# Patient Record
Sex: Male | Born: 1970 | Hispanic: No | Marital: Single | Smoking: Former smoker
Health system: Southern US, Community
[De-identification: ages and names within clinical notes are randomized; demographics above are authoritative.]

## PROBLEM LIST (undated history)

## (undated) DIAGNOSIS — I10 Essential (primary) hypertension: Secondary | ICD-10-CM

## (undated) DIAGNOSIS — F32A Depression, unspecified: Secondary | ICD-10-CM

## (undated) DIAGNOSIS — F909 Attention-deficit hyperactivity disorder, unspecified type: Secondary | ICD-10-CM

## (undated) DIAGNOSIS — M1990 Unspecified osteoarthritis, unspecified site: Secondary | ICD-10-CM

## (undated) DIAGNOSIS — F329 Major depressive disorder, single episode, unspecified: Secondary | ICD-10-CM

## (undated) DIAGNOSIS — F419 Anxiety disorder, unspecified: Secondary | ICD-10-CM

---

## 2007-12-02 HISTORY — PX: CHOLECYSTECTOMY: SHX55

## 2010-12-02 HISTORY — PX: KNEE ARTHROSCOPY: SUR90

## 2011-10-04 HISTORY — PX: LAPAROSCOPIC GASTRIC SLEEVE RESECTION: SHX5895

## 2014-12-02 HISTORY — PX: KNEE ARTHROSCOPY: SHX127

## 2018-01-24 ENCOUNTER — Other Ambulatory Visit: Payer: Self-pay | Admitting: Orthopedic Surgery

## 2018-02-09 ENCOUNTER — Encounter (HOSPITAL_COMMUNITY)
Admission: RE | Admit: 2018-02-09 | Discharge: 2018-02-09 | Disposition: A | Discharge status: Home / Self Care | Payer: No Typology Code available for payment source | Source: Ambulatory Visit | Attending: Orthopedic Surgery | Admitting: Orthopedic Surgery

## 2018-02-09 ENCOUNTER — Other Ambulatory Visit: Payer: Self-pay

## 2018-02-09 ENCOUNTER — Ambulatory Visit (HOSPITAL_COMMUNITY)
Admission: RE | Admit: 2018-02-09 | Discharge: 2018-02-09 | Disposition: A | Discharge status: Home / Self Care | Payer: No Typology Code available for payment source | Source: Ambulatory Visit | Attending: Orthopedic Surgery | Admitting: Orthopedic Surgery

## 2018-02-09 ENCOUNTER — Encounter (HOSPITAL_COMMUNITY): Payer: Self-pay

## 2018-02-09 DIAGNOSIS — Z01812 Encounter for preprocedural laboratory examination: Secondary | ICD-10-CM | POA: Diagnosis present

## 2018-02-09 DIAGNOSIS — Z01818 Encounter for other preprocedural examination: Secondary | ICD-10-CM | POA: Diagnosis present

## 2018-02-09 HISTORY — DX: Major depressive disorder, single episode, unspecified: F32.9

## 2018-02-09 HISTORY — DX: Attention-deficit hyperactivity disorder, unspecified type: F90.9

## 2018-02-09 HISTORY — DX: Depression, unspecified: F32.A

## 2018-02-09 HISTORY — DX: Essential (primary) hypertension: I10

## 2018-02-09 HISTORY — DX: Unspecified osteoarthritis, unspecified site: M19.90

## 2018-02-09 HISTORY — DX: Anxiety disorder, unspecified: F41.9

## 2018-02-09 LAB — APTT: aPTT: 29 seconds (ref 24–36)

## 2018-02-09 LAB — SURGICAL PCR SCREEN
MRSA, PCR: NEGATIVE
Staphylococcus aureus: NEGATIVE

## 2018-02-09 LAB — CBC WITH DIFFERENTIAL/PLATELET
Basophils Absolute: 0 10*3/uL (ref 0.0–0.1)
Basophils Relative: 0 %
Eosinophils Absolute: 0.2 10*3/uL (ref 0.0–0.7)
Eosinophils Relative: 3 %
HEMATOCRIT: 49.3 % (ref 39.0–52.0)
HEMOGLOBIN: 16.5 g/dL (ref 13.0–17.0)
LYMPHS ABS: 2.1 10*3/uL (ref 0.7–4.0)
LYMPHS PCT: 34 %
MCH: 30.2 pg (ref 26.0–34.0)
MCHC: 33.5 g/dL (ref 30.0–36.0)
MCV: 90.1 fL (ref 78.0–100.0)
MONOS PCT: 11 %
Monocytes Absolute: 0.7 10*3/uL (ref 0.1–1.0)
NEUTROS ABS: 3.2 10*3/uL (ref 1.7–7.7)
NEUTROS PCT: 52 %
Platelets: 245 10*3/uL (ref 150–400)
RBC: 5.47 MIL/uL (ref 4.22–5.81)
RDW: 13.3 % (ref 11.5–15.5)
WBC: 6 10*3/uL (ref 4.0–10.5)

## 2018-02-09 LAB — URINALYSIS, ROUTINE W REFLEX MICROSCOPIC
BILIRUBIN URINE: NEGATIVE
Glucose, UA: NEGATIVE mg/dL
Hgb urine dipstick: NEGATIVE
KETONES UR: 5 mg/dL — AB
LEUKOCYTES UA: NEGATIVE
Nitrite: NEGATIVE
PH: 6 (ref 5.0–8.0)
PROTEIN: 30 mg/dL — AB
Specific Gravity, Urine: 1.028 (ref 1.005–1.030)

## 2018-02-09 LAB — PROTIME-INR
INR: 0.95
Prothrombin Time: 12.6 seconds (ref 11.4–15.2)

## 2018-02-09 LAB — TYPE AND SCREEN
ABO/RH(D): O NEG
Antibody Screen: NEGATIVE

## 2018-02-09 LAB — BASIC METABOLIC PANEL
Anion gap: 8 (ref 5–15)
BUN: 13 mg/dL (ref 6–20)
CHLORIDE: 103 mmol/L (ref 101–111)
CO2: 28 mmol/L (ref 22–32)
Calcium: 9.1 mg/dL (ref 8.9–10.3)
Creatinine, Ser: 0.99 mg/dL (ref 0.61–1.24)
GFR calc non Af Amer: >60 mL/min (ref >60)
Glucose, Bld: 104 mg/dL — ABNORMAL HIGH (ref 65–99)
POTASSIUM: 4.3 mmol/L (ref 3.5–5.1)
SODIUM: 139 mmol/L (ref 135–145)

## 2018-02-09 LAB — ABO/RH: ABO/RH(D): O NEG

## 2018-02-09 NOTE — Pre-Procedure Instructions (Signed)
Steven Abbott  02/09/2018        Your procedure is scheduled on Friday, May 24.  Report to [facility] Admitting at 7:30 A.M.               Your surgery or procedure is scheduled for 9:30 AM   Call this number if you have problems the morning of surgery: [phone] - pre- op desk                For any other questions prior to surgery Monday - Friday, 8:00 AM - 4:00 PM, call [phone]- PAT desk, ask to speak to any nurse.     Remember:  Do not eat food or drink liquids after midnight  Thursday, May 23.   Take these medicines the morning of surgery with A SIP OF WATER: buPROPion (WELLBUTRIN XL)  busPIRone (BUSPAR) dicyclomine (BENTYL) metoprolol succinate (TOPROL-XL)  Take if needed and you tolerate on an empty stomach: oxyCODONE-acetaminophen (PERCOCET/ROXICET) or acetaminophen (TYLENOL)  1 Week prior to surgery STOP taking Aspirin, Aspirin Products (Goody Powder, Excedrin Migraine), Ibuprofen (Advil), Naproxen (Aleve), Vitamins and Herbal Products (ie Fish Oil)  Special instructions:   [facility]- Preparing For Surgery  Before surgery, you can play an important role. Because skin is not sterile, your skin needs to be as free of germs as possible. You can reduce the number of germs on your skin by washing with CHG (chlorahexidine gluconate) Soap before surgery.  CHG is an antiseptic cleaner which kills germs and bonds with the skin to continue killing germs even after washing.   Please do not use if you have an allergy to CHG or antibacterial soaps. If your skin becomes reddened/irritated stop using the CHG.  Do not shave (including legs and underarms) for at least 48 hours prior to first CHG shower. It is OK to shave your face.  Please follow these instructions carefully.   1. Shower the NIGHT BEFORE SURGERY and the MORNING OF SURGERY with CHG.   2. If you chose to wash your hair, wash your hair first as usual with your normal shampoo.  3. After you  shampoo, rinse your hair and body thoroughly to remove the shampoo.    Wash your face and private area with the soap you use at home, then rinse.  4. Use CHG as you would any other liquid soap. You can apply CHG directly to the skin and wash gently with a scrungie or a clean washcloth.   5. Apply the CHG Soap to your body ONLY FROM THE NECK DOWN.  Do not use on open wounds or open sores. Avoid contact with your eyes, ears, mouth and genitals (private parts).Wash thoroughly, paying special attention to the area where your surgery will be performed.  6. Thoroughly rinse your body with warm water from the neck down.  7. DO NOT shower/wash with your normal soap after using and rinsing off the CHG Soap.  8. Pat yourself dry with a CLEAN TOWEL.  9. Wear CLEAN PAJAMAS to bed the night before surgery, wear comfortable clothes the morning of surgery  10. Place CLEAN SHEETS on your bed the night of your first shower and DO NOT SLEEP WITH PETS.  Day of Surgery:  Shower as above. Do not apply any deodorants/lotions. Please wear clean clothes to the hospital/surgery center.  Remember to brush your teeth.    Do not wear jewelry, make-up or nail polish.  Do not wear lotions, powders, or perfumes,  or deodorant.  Do not shave 48 hours prior to surgery.  Men may shave face and neck.  Do not bring valuables to the hospital.  [facility] is not responsible for any belongings or valuables.  Oral Hygiene is also important to reduce your risk of infection.  Remember - BRUSH YOUR TEETH THE MORNING OF SURGERY  Contacts, dentures or bridgework may not be worn into surgery.  Leave your suitcase in the car.  After surgery it may be brought to your room.  For patients admitted to the hospital, discharge time will be determined by your treatment team.  Patients discharged the day of surgery will not be allowed to drive home.   Please read over the following fact sheets that you were given: Pain Booklet, Patient  Instructions for Mupirocin Application, Incentive Spirometry, Surgical Site Infections.

## 2018-02-09 NOTE — Pre-Procedure Instructions (Signed)
Gatsby Chismar  02/09/2018        Your procedure is scheduled on Friday, May 24.  Report to [facility] Admitting at 5:30 A.M.               Your surgery or procedure is scheduled for 7:30 AM   Call this number if you have problems the morning of surgery: [phone] - pre- op desk                For any other questions prior to surgery Monday - Friday, 8:00 AM - 4:00 PM, call [phone]- PAT desk, ask to speak to any nurse.     Remember:  Do not eat food or drink liquids after midnight  Thursday, May 23.   Take these medicines the morning of surgery with A SIP OF WATER: buPROPion (WELLBUTRIN XL)  busPIRone (BUSPAR) dicyclomine (BENTYL) metoprolol succinate (TOPROL-XL)  Take if needed and you tolerate on an empty stomach: oxyCODONE-acetaminophen (PERCOCET/ROXICET) or acetaminophen (TYLENOL)  1 Week prior to surgery STOP taking Aspirin, Aspirin Products (Goody Powder, Excedrin Migraine), Ibuprofen (Advil), Naproxen (Aleve), Vitamins and Herbal Products (ie Fish Oil)  Special instructions:   [place]- Preparing For Surgery  Before surgery, you can play an important role. Because skin is not sterile, your skin needs to be as free of germs as possible. You can reduce the number of germs on your skin by washing with CHG (chlorahexidine gluconate) Soap before surgery.  CHG is an antiseptic cleaner which kills germs and bonds with the skin to continue killing germs even after washing.   Please do not use if you have an allergy to CHG or antibacterial soaps. If your skin becomes reddened/irritated stop using the CHG.  Do not shave (including legs and underarms) for at least 48 hours prior to first CHG shower. It is OK to shave your face.  Please follow these instructions carefully.   1. Shower the NIGHT BEFORE SURGERY and the MORNING OF SURGERY with CHG.   2. If you chose to wash your hair, wash your hair first as usual with your normal shampoo.  3. After you  shampoo, rinse your hair and body thoroughly to remove the shampoo.    Wash your face and private area with the soap you use at home, then rinse.  4. Use CHG as you would any other liquid soap. You can apply CHG directly to the skin and wash gently with a scrungie or a clean washcloth.   5. Apply the CHG Soap to your body ONLY FROM THE NECK DOWN.  Do not use on open wounds or open sores. Avoid contact with your eyes, ears, mouth and genitals (private parts).Wash thoroughly, paying special attention to the area where your surgery will be performed.  6. Thoroughly rinse your body with warm water from the neck down.  7. DO NOT shower/wash with your normal soap after using and rinsing off the CHG Soap.  8. Pat yourself dry with a CLEAN TOWEL.  9. Wear CLEAN PAJAMAS to bed the night before surgery, wear comfortable clothes the morning of surgery  10. Place CLEAN SHEETS on your bed the night of your first shower and DO NOT SLEEP WITH PETS.  Day of Surgery:  Shower as above. Do not apply any deodorants/lotions. Please wear clean clothes to the hospital/surgery center.  Remember to brush your teeth.    Do not wear jewelry, make-up or nail polish.  Do not wear lotions, powders, or perfumes,  or deodorant.  Do not shave 48 hours prior to surgery.  Men may shave face and neck.  Do not bring valuables to the hospital.  [facility] is not responsible for any belongings or valuables.  Oral Hygiene is also important to reduce your risk of infection.  Remember - BRUSH YOUR TEETH THE MORNING OF SURGERY  Contacts, dentures or bridgework may not be worn into surgery.  Leave your suitcase in the car.  After surgery it may be brought to your room.  For patients admitted to the hospital, discharge time will be determined by your treatment team.  Patients discharged the day of surgery will not be allowed to drive home.   Please read over the following fact sheets that you were given: Pain Booklet, Patient  Instructions for Mupirocin Application, Incentive Spirometry, Surgical Site Infections.

## 2018-02-12 ENCOUNTER — Other Ambulatory Visit (HOSPITAL_COMMUNITY): Payer: Self-pay

## 2018-02-20 DIAGNOSIS — M1712 Unilateral primary osteoarthritis, left knee: Secondary | ICD-10-CM | POA: Diagnosis present

## 2018-02-20 NOTE — H&P (Signed)
TOTAL KNEE ADMISSION H&P  Patient is being admitted for left total knee arthroplasty.  Subjective:  Chief Complaint:left knee pain.  HPI: Steven Abbott, 47 y.o. male, has a history of pain and functional disability in the left knee due to arthritis and has failed non-surgical conservative treatments for greater than 12 weeks to includeNSAID's and/or analgesics, flexibility and strengthening excercises, supervised PT with diminished ADL's post treatment and activity modification.  Onset of symptoms was abrupt, starting 3 years ago with gradually worsening course since that time. The patient noted prior procedures on the knee to include  arthroscopy on the left knee(s).  Patient currently rates pain in the left knee(s) at 10 out of 10 with activity. Patient has night pain, worsening of pain with activity and weight bearing, pain that interferes with activities of daily living, pain with passive range of motion, crepitus and joint swelling.  Patient has evidence of joint space narrowing by imaging studies.   There is no active infection.  There are no active problems to display for this patient.  Past Medical History:  Diagnosis Date  . ADHD   . Anxiety   . Arthritis   . Depression   . Hypertension     Past Surgical History:  Procedure Laterality Date  . CHOLECYSTECTOMY  12/2007  . KNEE ARTHROSCOPY  12/2010   "cleaned it out"  . KNEE ARTHROSCOPY  12/2014   MCL  . LAPAROSCOPIC GASTRIC SLEEVE RESECTION  2013    No current facility-administered medications for this encounter.    Current Outpatient Medications  Medication Sig Dispense Refill Last Dose  . acetaminophen (TYLENOL) 650 MG CR tablet Take 1,300 mg by mouth 2 (two) times daily as needed for pain.     Marland Kitchen amphetamine-dextroamphetamine (ADDERALL) 20 MG tablet Take 20 mg by mouth 2 (two) times daily.     Marland Kitchen buPROPion (WELLBUTRIN XL) 300 MG 24 hr tablet Take 300 mg by mouth daily.     . busPIRone (BUSPAR) 10 MG tablet Take 10 mg by  mouth 3 (three) times daily.     . Cholecalciferol (VITAMIN D3 PO) Take 1 capsule by mouth daily.     . Cyanocobalamin (B-12) 1000 MCG SUBL Place 1,000 mcg under the tongue daily.     Marland Kitchen dicyclomine (BENTYL) 10 MG capsule Take 10 mg by mouth 2 (two) times daily.     . Ferrous Sulfate Dried (SLOW IRON PO) Take 1 tablet by mouth daily.     Marland Kitchen lidocaine (LMX) 4 % cream Apply 1 application topically as needed (knee pain).     . Melatonin 10 MG TABS Take 30 mg by mouth at bedtime as needed (sleep).     . metoprolol succinate (TOPROL-XL) 50 MG 24 hr tablet Take 50 mg by mouth daily. Take with or immediately following a meal.     . Multiple Vitamin (MULTIVITAMIN WITH MINERALS) TABS tablet Take 1 tablet by mouth 2 (two) times daily.     Marland Kitchen oxyCODONE-acetaminophen (PERCOCET/ROXICET) 5-325 MG tablet Take 1 tablet by mouth 4 (four) times daily as needed for severe pain.     Marland Kitchen testosterone enanthate (DELATESTRYL) 200 MG/ML injection Inject 200 mg into the muscle every 14 (fourteen) days. For IM use only     . ranitidine (ZANTAC) 150 MG capsule Take 150 mg by mouth 2 (two) times daily.      No Known Allergies  Social History   Tobacco Use  . Smoking status: Former Smoker    Years: 9.00  Last attempt to quit: 1998    Years since quitting: 21.3  . Smokeless tobacco: Never Used  Substance Use Topics  . Alcohol use: Not Currently    Family History  Problem Relation Age of Onset  . Arthritis Mother   . Diabetes Father   . Stroke Father   . Microcephaly Father   . Prostate cancer Father      Review of Systems  Constitutional: Negative.   HENT: Negative.   Eyes: Negative.   Respiratory: Negative.   Cardiovascular:       HTN  Gastrointestinal: Positive for heartburn.  Genitourinary: Negative.   Musculoskeletal: Positive for joint pain.  Skin: Negative.   Neurological: Negative.   Endo/Heme/Allergies: Negative.   Psychiatric/Behavioral: Positive for depression.    Objective:  Physical  Exam  Constitutional: He is oriented to person, place, and time. He appears well-developed and well-nourished.  HENT:  Head: Normocephalic and atraumatic.  Eyes: Pupils are equal, round, and reactive to light.  Neck: Normal range of motion. Neck supple.  Cardiovascular: Intact distal pulses.  Respiratory: Effort normal.  Musculoskeletal: He exhibits tenderness.  The left knee is tender along the medial joint line.  Range of motion is 8/115 collateral ligaments are stable.  Varus stress exacerbates his pain no palpable effusion, crepitus as you taken through range of motion.  Neurological: He is alert and oriented to person, place, and time.  Skin: Skin is warm and dry.  Psychiatric: He has a normal mood and affect. His behavior is normal. Judgment and thought content normal.    Vital signs in last 24 hours:    Labs:   Estimated body mass index is 32.72 kg/m as calculated from the following:   Height as of 02/09/18:  (1.803 m).   Weight as of 02/09/18: 106.4 kg (234 lb 9.6 oz).   Imaging Review Plain radiographs demonstrateAP, Rosenberg, lateral and sunrise x-rays show moderate to severe arthritis of the medial compartment of the left knee.  There are peripheral osteophytes and loss of a little over one half the articular cartilage thickness to the medial compartment.     Preoperative templating of the joint replacement has been completed, documented, and submitted to the Operating Room personnel in order to optimize intra-operative equipment management.   Anticipated LOS equal to or greater than 2 midnights due to - Age 64 and older with one or more of the following:  - Obesity  - Expected need for hospital services (PT, OT, Nursing) required for safe  discharge   Assessment/Plan:  End stage arthritis, left knee   The patient history, physical examination, clinical judgment of the provider and imaging studies are consistent with end stage degenerative joint disease of  the left knee(s) and total knee arthroplasty is deemed medically necessary. The treatment options including medical management, injection therapy arthroscopy and arthroplasty were discussed at length. The risks and benefits of total knee arthroplasty were presented and reviewed. The risks due to aseptic loosening, infection, stiffness, patella tracking problems, thromboembolic complications and other imponderables were discussed. The patient acknowledged the explanation, agreed to proceed with the plan and consent was signed. Patient is being admitted for inpatient treatment for surgery, pain control, PT, OT, prophylactic antibiotics, VTE prophylaxis, progressive ambulation and ADL's and discharge planning. The patient is planning to be discharged home with home health services.

## 2018-02-22 MED ORDER — TRANEXAMIC ACID 1000 MG/10ML IV SOLN
2000.0000 mg | INTRAVENOUS | Status: DC
  Filled 2018-02-22: qty 20

## 2018-02-22 MED ORDER — TRANEXAMIC ACID 1000 MG/10ML IV SOLN
1000.0000 mg | INTRAVENOUS | Status: AC
  Administered 2018-02-23: 1000 mg via INTRAVENOUS
  Filled 2018-02-22: qty 1100

## 2018-02-22 MED ORDER — BUPIVACAINE LIPOSOME 1.3 % IJ SUSP
20.0000 mL | Freq: Once | INTRAMUSCULAR | Status: DC
  Filled 2018-02-22: qty 20

## 2018-02-23 ENCOUNTER — Inpatient Hospital Stay (HOSPITAL_COMMUNITY): Payer: No Typology Code available for payment source | Admitting: Anesthesiology

## 2018-02-23 ENCOUNTER — Inpatient Hospital Stay (HOSPITAL_COMMUNITY)
Admission: RE | Admit: 2018-02-23 | Discharge: 2018-02-24 | DRG: 470 | Disposition: A | Discharge status: Home / Self Care | Payer: No Typology Code available for payment source | Source: Ambulatory Visit | Attending: Orthopedic Surgery | Admitting: Orthopedic Surgery

## 2018-02-23 ENCOUNTER — Encounter (HOSPITAL_COMMUNITY): Payer: Self-pay | Admitting: *Deleted

## 2018-02-23 ENCOUNTER — Encounter (HOSPITAL_COMMUNITY)
Admission: RE | Disposition: A | Discharge status: Home / Self Care | Payer: Self-pay | Source: Ambulatory Visit | Attending: Orthopedic Surgery

## 2018-02-23 DIAGNOSIS — Z833 Family history of diabetes mellitus: Secondary | ICD-10-CM

## 2018-02-23 DIAGNOSIS — Z6832 Body mass index (BMI) 32.0-32.9, adult: Secondary | ICD-10-CM

## 2018-02-23 DIAGNOSIS — E669 Obesity, unspecified: Secondary | ICD-10-CM | POA: Diagnosis present

## 2018-02-23 DIAGNOSIS — F909 Attention-deficit hyperactivity disorder, unspecified type: Secondary | ICD-10-CM | POA: Diagnosis present

## 2018-02-23 DIAGNOSIS — Z87891 Personal history of nicotine dependence: Secondary | ICD-10-CM

## 2018-02-23 DIAGNOSIS — M1712 Unilateral primary osteoarthritis, left knee: Secondary | ICD-10-CM | POA: Diagnosis present

## 2018-02-23 DIAGNOSIS — Z9049 Acquired absence of other specified parts of digestive tract: Secondary | ICD-10-CM

## 2018-02-23 DIAGNOSIS — F419 Anxiety disorder, unspecified: Secondary | ICD-10-CM | POA: Diagnosis present

## 2018-02-23 DIAGNOSIS — Z8042 Family history of malignant neoplasm of prostate: Secondary | ICD-10-CM | POA: Diagnosis not present

## 2018-02-23 DIAGNOSIS — Z823 Family history of stroke: Secondary | ICD-10-CM | POA: Diagnosis not present

## 2018-02-23 DIAGNOSIS — M25562 Pain in left knee: Secondary | ICD-10-CM | POA: Diagnosis present

## 2018-02-23 DIAGNOSIS — Z8261 Family history of arthritis: Secondary | ICD-10-CM

## 2018-02-23 DIAGNOSIS — I10 Essential (primary) hypertension: Secondary | ICD-10-CM | POA: Diagnosis present

## 2018-02-23 DIAGNOSIS — F329 Major depressive disorder, single episode, unspecified: Secondary | ICD-10-CM | POA: Diagnosis present

## 2018-02-23 HISTORY — PX: TOTAL KNEE ARTHROPLASTY: SHX125

## 2018-02-23 SURGERY — ARTHROPLASTY, KNEE, TOTAL
Anesthesia: Spinal | Site: Knee | Laterality: Left

## 2018-02-23 MED ORDER — FENTANYL CITRATE (PF) 100 MCG/2ML IJ SOLN
INTRAMUSCULAR | Status: AC
  Filled 2018-02-23: qty 2

## 2018-02-23 MED ORDER — METOCLOPRAMIDE HCL 5 MG PO TABS: 5.0000 mg | ORAL_TABLET | Freq: Three times a day (TID) | ORAL | Status: DC | PRN

## 2018-02-23 MED ORDER — PHENYLEPHRINE 40 MCG/ML (10ML) SYRINGE FOR IV PUSH (FOR BLOOD PRESSURE SUPPORT)
PREFILLED_SYRINGE | INTRAVENOUS | Status: AC
  Filled 2018-02-23: qty 10

## 2018-02-23 MED ORDER — FENTANYL CITRATE (PF) 100 MCG/2ML IJ SOLN
25.0000 ug | INTRAMUSCULAR | Status: DC | PRN
  Administered 2018-02-23: 50 ug via INTRAVENOUS

## 2018-02-23 MED ORDER — BUPROPION HCL ER (XL) 150 MG PO TB24
300.0000 mg | ORAL_TABLET | Freq: Every day | ORAL | Status: DC
  Administered 2018-02-23 – 2018-02-24 (×2): 300 mg via ORAL
  Filled 2018-02-23 (×3): qty 2

## 2018-02-23 MED ORDER — SODIUM CHLORIDE 0.9 % IR SOLN
Status: DC | PRN
  Administered 2018-02-23: 3000 mL

## 2018-02-23 MED ORDER — PROPOFOL 500 MG/50ML IV EMUL
INTRAVENOUS | Status: DC | PRN
  Administered 2018-02-23: 50 ug/kg/min via INTRAVENOUS

## 2018-02-23 MED ORDER — OXYCODONE HCL 5 MG PO TABS
5.0000 mg | ORAL_TABLET | ORAL | Status: DC | PRN
  Administered 2018-02-23 – 2018-02-24 (×5): 15 mg via ORAL
  Filled 2018-02-23 (×5): qty 3

## 2018-02-23 MED ORDER — FLEET ENEMA 7-19 GM/118ML RE ENEM: 1.0000 | ENEMA | Freq: Once | RECTAL | Status: DC | PRN

## 2018-02-23 MED ORDER — OXYCODONE HCL 5 MG PO TABS
5.0000 mg | ORAL_TABLET | Freq: Once | ORAL | Status: AC | PRN
  Administered 2018-02-23: 5 mg via ORAL

## 2018-02-23 MED ORDER — METHOCARBAMOL 500 MG PO TABS: 500.0000 mg | ORAL_TABLET | Freq: Two times a day (BID) | ORAL | 0 refills | Status: DC

## 2018-02-23 MED ORDER — ALUM & MAG HYDROXIDE-SIMETH 200-200-20 MG/5ML PO SUSP
30.0000 mL | ORAL | Status: DC | PRN
  Administered 2018-02-23: 30 mL via ORAL
  Filled 2018-02-23: qty 30

## 2018-02-23 MED ORDER — TRANEXAMIC ACID 1000 MG/10ML IV SOLN
1000.0000 mg | Freq: Once | INTRAVENOUS | Status: AC
  Administered 2018-02-23: 1000 mg via INTRAVENOUS
  Filled 2018-02-23: qty 10

## 2018-02-23 MED ORDER — OXYCODONE HCL 5 MG/5ML PO SOLN: 5.0000 mg | Freq: Once | ORAL | Status: AC | PRN

## 2018-02-23 MED ORDER — METOCLOPRAMIDE HCL 5 MG/ML IJ SOLN: 5.0000 mg | Freq: Three times a day (TID) | INTRAMUSCULAR | Status: DC | PRN

## 2018-02-23 MED ORDER — DIPHENHYDRAMINE HCL 12.5 MG/5ML PO ELIX: 12.5000 mg | ORAL_SOLUTION | ORAL | Status: DC | PRN

## 2018-02-23 MED ORDER — PHENOL 1.4 % MT LIQD: 1.0000 | OROMUCOSAL | Status: DC | PRN

## 2018-02-23 MED ORDER — FENTANYL CITRATE (PF) 250 MCG/5ML IJ SOLN
INTRAMUSCULAR | Status: AC
  Filled 2018-02-23: qty 5

## 2018-02-23 MED ORDER — BUPIVACAINE-EPINEPHRINE (PF) 0.5% -1:200000 IJ SOLN
INTRAMUSCULAR | Status: DC | PRN
  Administered 2018-02-23: 30 mL via PERINEURAL

## 2018-02-23 MED ORDER — CEFAZOLIN SODIUM-DEXTROSE 2-4 GM/100ML-% IV SOLN
INTRAVENOUS | Status: AC
  Filled 2018-02-23: qty 100

## 2018-02-23 MED ORDER — ACETAMINOPHEN 325 MG PO TABS
325.0000 mg | ORAL_TABLET | Freq: Four times a day (QID) | ORAL | Status: DC | PRN
  Administered 2018-02-23 – 2018-02-24 (×2): 650 mg via ORAL
  Filled 2018-02-23 (×2): qty 2

## 2018-02-23 MED ORDER — SODIUM CHLORIDE 0.9 % IJ SOLN
INTRAMUSCULAR | Status: DC | PRN
  Administered 2018-02-23: 50 mL

## 2018-02-23 MED ORDER — POLYETHYLENE GLYCOL 3350 17 G PO PACK: 17.0000 g | PACK | Freq: Every day | ORAL | Status: DC | PRN

## 2018-02-23 MED ORDER — OXYCODONE HCL 5 MG PO TABS
5.0000 mg | ORAL_TABLET | ORAL | Status: DC | PRN
  Administered 2018-02-23 (×2): 10 mg via ORAL
  Filled 2018-02-23 (×2): qty 2

## 2018-02-23 MED ORDER — CELECOXIB 200 MG PO CAPS: 200.0000 mg | ORAL_CAPSULE | Freq: Two times a day (BID) | ORAL | 2 refills | Status: AC

## 2018-02-23 MED ORDER — CEFAZOLIN SODIUM-DEXTROSE 2-4 GM/100ML-% IV SOLN
2.0000 g | INTRAVENOUS | Status: AC
  Administered 2018-02-23: 2 g via INTRAVENOUS

## 2018-02-23 MED ORDER — TRANEXAMIC ACID 1000 MG/10ML IV SOLN
INTRAVENOUS | Status: DC | PRN
  Administered 2018-02-23: 2000 mg via TOPICAL

## 2018-02-23 MED ORDER — ONDANSETRON HCL 4 MG PO TABS: 4.0000 mg | ORAL_TABLET | Freq: Four times a day (QID) | ORAL | Status: DC | PRN

## 2018-02-23 MED ORDER — MIDAZOLAM HCL 2 MG/2ML IJ SOLN
2.0000 mg | Freq: Once | INTRAMUSCULAR | Status: AC
  Administered 2018-02-23: 2 mg via INTRAVENOUS

## 2018-02-23 MED ORDER — DEXAMETHASONE SODIUM PHOSPHATE 10 MG/ML IJ SOLN
10.0000 mg | Freq: Once | INTRAMUSCULAR | Status: AC
  Administered 2018-02-24: 10 mg via INTRAVENOUS
  Filled 2018-02-23: qty 1

## 2018-02-23 MED ORDER — MENTHOL 3 MG MT LOZG: 1.0000 | LOZENGE | OROMUCOSAL | Status: DC | PRN

## 2018-02-23 MED ORDER — CYCLOBENZAPRINE HCL 10 MG PO TABS
10.0000 mg | ORAL_TABLET | Freq: Four times a day (QID) | ORAL | Status: DC | PRN
  Administered 2018-02-23 – 2018-02-24 (×2): 10 mg via ORAL
  Filled 2018-02-23 (×2): qty 1

## 2018-02-23 MED ORDER — MIDAZOLAM HCL 5 MG/5ML IJ SOLN
INTRAMUSCULAR | Status: DC | PRN
  Administered 2018-02-23: 2 mg via INTRAVENOUS

## 2018-02-23 MED ORDER — LACTATED RINGERS IV SOLN: INTRAVENOUS | Status: DC

## 2018-02-23 MED ORDER — LACTATED RINGERS IV SOLN
INTRAVENOUS | Status: DC
  Administered 2018-02-23 (×2): via INTRAVENOUS

## 2018-02-23 MED ORDER — ONDANSETRON HCL 4 MG/2ML IJ SOLN
INTRAMUSCULAR | Status: DC | PRN
  Administered 2018-02-23: 4 mg via INTRAVENOUS

## 2018-02-23 MED ORDER — GABAPENTIN 300 MG PO CAPS: 300.0000 mg | ORAL_CAPSULE | Freq: Three times a day (TID) | ORAL | 2 refills | Status: AC

## 2018-02-23 MED ORDER — PHENYLEPHRINE 40 MCG/ML (10ML) SYRINGE FOR IV PUSH (FOR BLOOD PRESSURE SUPPORT)
PREFILLED_SYRINGE | INTRAVENOUS | Status: DC | PRN
  Administered 2018-02-23: 80 ug via INTRAVENOUS

## 2018-02-23 MED ORDER — DIPHENHYDRAMINE HCL 50 MG/ML IJ SOLN
INTRAMUSCULAR | Status: DC | PRN
  Administered 2018-02-23: 12.5 mg via INTRAVENOUS

## 2018-02-23 MED ORDER — METHOCARBAMOL 500 MG PO TABS
500.0000 mg | ORAL_TABLET | Freq: Four times a day (QID) | ORAL | Status: DC | PRN
Start: 2018-02-23 — End: 2018-02-23
  Administered 2018-02-23 (×2): 500 mg via ORAL
  Filled 2018-02-23: qty 1

## 2018-02-23 MED ORDER — FERROUS SULFATE DRIED ER 160 (50 FE) MG PO TBCR: 1.0000 | EXTENDED_RELEASE_TABLET | Freq: Every day | ORAL | Status: DC

## 2018-02-23 MED ORDER — HYDROMORPHONE HCL 2 MG/ML IJ SOLN
0.5000 mg | INTRAMUSCULAR | Status: DC | PRN
  Administered 2018-02-23 – 2018-02-24 (×3): 1 mg via INTRAVENOUS
  Filled 2018-02-23 (×3): qty 1

## 2018-02-23 MED ORDER — FENTANYL CITRATE (PF) 100 MCG/2ML IJ SOLN
50.0000 ug | Freq: Once | INTRAMUSCULAR | Status: AC
  Administered 2018-02-23: 50 ug via INTRAVENOUS

## 2018-02-23 MED ORDER — BUPIVACAINE IN DEXTROSE 0.75-8.25 % IT SOLN
INTRATHECAL | Status: DC | PRN
  Administered 2018-02-23: 2 mL via INTRATHECAL

## 2018-02-23 MED ORDER — GABAPENTIN 300 MG PO CAPS
300.0000 mg | ORAL_CAPSULE | Freq: Three times a day (TID) | ORAL | Status: DC
  Administered 2018-02-23 – 2018-02-24 (×3): 300 mg via ORAL
  Filled 2018-02-23 (×3): qty 1

## 2018-02-23 MED ORDER — METHOCARBAMOL 500 MG PO TABS
ORAL_TABLET | ORAL | Status: AC
  Filled 2018-02-23: qty 1

## 2018-02-23 MED ORDER — PANTOPRAZOLE SODIUM 40 MG PO TBEC
40.0000 mg | DELAYED_RELEASE_TABLET | Freq: Every day | ORAL | Status: DC
  Administered 2018-02-24: 40 mg via ORAL
  Filled 2018-02-23: qty 1

## 2018-02-23 MED ORDER — METOPROLOL SUCCINATE ER 50 MG PO TB24
50.0000 mg | ORAL_TABLET | Freq: Once | ORAL | Status: AC
  Administered 2018-02-23: 50 mg via ORAL

## 2018-02-23 MED ORDER — BUPIVACAINE-EPINEPHRINE (PF) 0.25% -1:200000 IJ SOLN
INTRAMUSCULAR | Status: DC | PRN
  Administered 2018-02-23: 50 mL via PERINEURAL

## 2018-02-23 MED ORDER — FERROUS SULFATE 325 (65 FE) MG PO TABS
325.0000 mg | ORAL_TABLET | Freq: Every day | ORAL | Status: DC
  Administered 2018-02-24: 325 mg via ORAL
  Filled 2018-02-23: qty 1

## 2018-02-23 MED ORDER — OXYCODONE-ACETAMINOPHEN 5-325 MG PO TABS: 1.0000 | ORAL_TABLET | ORAL | 0 refills | Status: DC | PRN

## 2018-02-23 MED ORDER — DICYCLOMINE HCL 10 MG PO CAPS
10.0000 mg | ORAL_CAPSULE | Freq: Two times a day (BID) | ORAL | Status: DC
  Administered 2018-02-23 – 2018-02-24 (×2): 10 mg via ORAL
  Filled 2018-02-23 (×2): qty 1

## 2018-02-23 MED ORDER — BUSPIRONE HCL 10 MG PO TABS
10.0000 mg | ORAL_TABLET | Freq: Three times a day (TID) | ORAL | Status: DC
  Administered 2018-02-23 – 2018-02-24 (×3): 10 mg via ORAL
  Filled 2018-02-23 (×2): qty 1

## 2018-02-23 MED ORDER — METOPROLOL SUCCINATE ER 50 MG PO TB24
50.0000 mg | ORAL_TABLET | Freq: Every day | ORAL | Status: DC
  Administered 2018-02-24: 50 mg via ORAL
  Filled 2018-02-23: qty 1

## 2018-02-23 MED ORDER — ONDANSETRON HCL 4 MG/2ML IJ SOLN
INTRAMUSCULAR | Status: AC
  Filled 2018-02-23: qty 2

## 2018-02-23 MED ORDER — MELATONIN 3 MG PO TABS
30.0000 mg | ORAL_TABLET | Freq: Every evening | ORAL | Status: DC | PRN
  Filled 2018-02-23: qty 10

## 2018-02-23 MED ORDER — MIDAZOLAM HCL 2 MG/2ML IJ SOLN
INTRAMUSCULAR | Status: AC
  Filled 2018-02-23: qty 2

## 2018-02-23 MED ORDER — FAMOTIDINE 20 MG PO TABS
20.0000 mg | ORAL_TABLET | Freq: Two times a day (BID) | ORAL | Status: DC
  Administered 2018-02-23 – 2018-02-24 (×2): 20 mg via ORAL
  Filled 2018-02-23 (×2): qty 1

## 2018-02-23 MED ORDER — DIPHENHYDRAMINE HCL 50 MG/ML IJ SOLN
INTRAMUSCULAR | Status: AC
  Filled 2018-02-23: qty 1

## 2018-02-23 MED ORDER — OXYCODONE HCL 5 MG PO TABS
ORAL_TABLET | ORAL | Status: AC
  Filled 2018-02-23: qty 3

## 2018-02-23 MED ORDER — METHOCARBAMOL 1000 MG/10ML IJ SOLN
500.0000 mg | Freq: Four times a day (QID) | INTRAMUSCULAR | Status: DC | PRN
Start: 2018-02-23 — End: 2018-02-23
  Filled 2018-02-23: qty 5

## 2018-02-23 MED ORDER — METOPROLOL SUCCINATE ER 25 MG PO TB24
ORAL_TABLET | ORAL | Status: AC
  Filled 2018-02-23: qty 2

## 2018-02-23 MED ORDER — DOCUSATE SODIUM 100 MG PO CAPS
100.0000 mg | ORAL_CAPSULE | Freq: Two times a day (BID) | ORAL | Status: DC
  Administered 2018-02-24: 100 mg via ORAL
  Filled 2018-02-23: qty 1

## 2018-02-23 MED ORDER — ONDANSETRON HCL 4 MG/2ML IJ SOLN: 4.0000 mg | Freq: Four times a day (QID) | INTRAMUSCULAR | Status: DC | PRN

## 2018-02-23 MED ORDER — CHLORHEXIDINE GLUCONATE 4 % EX LIQD: 60.0000 mL | Freq: Once | CUTANEOUS | Status: DC

## 2018-02-23 MED ORDER — PROMETHAZINE HCL 25 MG/ML IJ SOLN: 6.2500 mg | INTRAMUSCULAR | Status: DC | PRN

## 2018-02-23 MED ORDER — ASPIRIN EC 81 MG PO TBEC: 81.0000 mg | DELAYED_RELEASE_TABLET | Freq: Two times a day (BID) | ORAL | 0 refills | Status: DC

## 2018-02-23 MED ORDER — BISACODYL 5 MG PO TBEC: 5.0000 mg | DELAYED_RELEASE_TABLET | Freq: Every day | ORAL | Status: DC | PRN

## 2018-02-23 MED ORDER — KCL IN DEXTROSE-NACL 20-5-0.45 MEQ/L-%-% IV SOLN
INTRAVENOUS | Status: DC
  Administered 2018-02-23: 125 mL/h via INTRAVENOUS
  Filled 2018-02-23: qty 1000

## 2018-02-23 MED ORDER — AMPHETAMINE-DEXTROAMPHETAMINE 10 MG PO TABS
20.0000 mg | ORAL_TABLET | Freq: Two times a day (BID) | ORAL | Status: DC
  Administered 2018-02-23 – 2018-02-24 (×2): 20 mg via ORAL
  Filled 2018-02-23 (×2): qty 2

## 2018-02-23 MED ORDER — EPHEDRINE SULFATE 50 MG/ML IJ SOLN
INTRAMUSCULAR | Status: DC | PRN
  Administered 2018-02-23 (×3): 10 mg via INTRAVENOUS

## 2018-02-23 MED ORDER — ASPIRIN EC 81 MG PO TBEC
81.0000 mg | DELAYED_RELEASE_TABLET | Freq: Every day | ORAL | Status: DC
  Administered 2018-02-24: 81 mg via ORAL
  Filled 2018-02-23: qty 1

## 2018-02-23 MED ORDER — BUPIVACAINE LIPOSOME 1.3 % IJ SUSP
INTRAMUSCULAR | Status: DC | PRN
  Administered 2018-02-23: 20 mL

## 2018-02-23 SURGICAL SUPPLY — 51 items
BANDAGE ESMARK 6X9 LF (GAUZE/BANDAGES/DRESSINGS) ×1 IMPLANT
BLADE SAG 18X100X1.27 (BLADE) ×2 IMPLANT
BLADE SAGITTAL 13X1.27X60 (BLADE) ×2 IMPLANT
BLADE SAW SGTL 13X75X1.27 (BLADE) IMPLANT
BNDG ELASTIC 6X10 VLCR STRL LF (GAUZE/BANDAGES/DRESSINGS) ×2 IMPLANT
BNDG ESMARK 6X9 LF (GAUZE/BANDAGES/DRESSINGS) ×2
BOWL SMART MIX CTS (DISPOSABLE) ×2 IMPLANT
CAPT KNEE TOTAL 3 ATTUNE ×2 IMPLANT
CEMENT HV SMART SET (Cement) ×4 IMPLANT
COVER SURGICAL LIGHT HANDLE (MISCELLANEOUS) ×2 IMPLANT
CUFF TOURNIQUET SINGLE 34IN LL (TOURNIQUET CUFF) ×2 IMPLANT
CUFF TOURNIQUET SINGLE 44IN (TOURNIQUET CUFF) IMPLANT
DRAPE EXTREMITY T 121X128X90 (DRAPE) ×2 IMPLANT
DRAPE U-SHAPE 47X51 STRL (DRAPES) ×2 IMPLANT
DRSG AQUACEL AG ADV 3.5X10 (GAUZE/BANDAGES/DRESSINGS) ×2 IMPLANT
DURAPREP 26ML APPLICATOR (WOUND CARE) ×2 IMPLANT
ELECT REM PT RETURN 9FT ADLT (ELECTROSURGICAL) ×2
ELECTRODE REM PT RTRN 9FT ADLT (ELECTROSURGICAL) ×1 IMPLANT
GLOVE BIO SURGEON STRL SZ7.5 (GLOVE) ×2 IMPLANT
GLOVE BIO SURGEON STRL SZ8.5 (GLOVE) ×4 IMPLANT
GLOVE BIOGEL PI IND STRL 8 (GLOVE) ×2 IMPLANT
GLOVE BIOGEL PI IND STRL 9 (GLOVE) ×1 IMPLANT
GLOVE BIOGEL PI INDICATOR 8 (GLOVE) ×2
GLOVE BIOGEL PI INDICATOR 9 (GLOVE) ×1
GOWN STRL REUS W/ TWL LRG LVL3 (GOWN DISPOSABLE) ×1 IMPLANT
GOWN STRL REUS W/ TWL XL LVL3 (GOWN DISPOSABLE) ×2 IMPLANT
GOWN STRL REUS W/TWL LRG LVL3 (GOWN DISPOSABLE) ×1
GOWN STRL REUS W/TWL XL LVL3 (GOWN DISPOSABLE) ×2
HANDPIECE INTERPULSE COAX TIP (DISPOSABLE) ×1
HOOD PEEL AWAY FACE SHEILD DIS (HOOD) ×8 IMPLANT
HOOD PEEL AWAY FLYTE STAYCOOL (MISCELLANEOUS) ×2 IMPLANT
KIT BASIN OR (CUSTOM PROCEDURE TRAY) ×2 IMPLANT
KIT TURNOVER KIT B (KITS) ×2 IMPLANT
MANIFOLD NEPTUNE II (INSTRUMENTS) ×2 IMPLANT
NEEDLE 22X1 1/2 (OR ONLY) (NEEDLE) ×4 IMPLANT
NS IRRIG 1000ML POUR BTL (IV SOLUTION) ×2 IMPLANT
PACK TOTAL JOINT (CUSTOM PROCEDURE TRAY) ×2 IMPLANT
PAD ARMBOARD 7.5X6 YLW CONV (MISCELLANEOUS) ×4 IMPLANT
SET HNDPC FAN SPRY TIP SCT (DISPOSABLE) ×1 IMPLANT
SUT VIC AB 0 CT1 27 (SUTURE) ×1
SUT VIC AB 0 CT1 27XBRD ANBCTR (SUTURE) ×1 IMPLANT
SUT VIC AB 1 CTX 36 (SUTURE) ×1
SUT VIC AB 1 CTX36XBRD ANBCTR (SUTURE) ×1 IMPLANT
SUT VIC AB 2-0 CT1 27 (SUTURE) ×1
SUT VIC AB 2-0 CT1 TAPERPNT 27 (SUTURE) ×1 IMPLANT
SUT VIC AB 3-0 CT1 27 (SUTURE) ×1
SUT VIC AB 3-0 CT1 TAPERPNT 27 (SUTURE) ×1 IMPLANT
SYR CONTROL 10ML LL (SYRINGE) ×4 IMPLANT
TOWEL OR 17X24 6PK STRL BLUE (TOWEL DISPOSABLE) ×2 IMPLANT
TOWEL OR 17X26 10 PK STRL BLUE (TOWEL DISPOSABLE) ×2 IMPLANT
TRAY CATH 16FR W/PLASTIC CATH (SET/KITS/TRAYS/PACK) ×2 IMPLANT

## 2018-02-23 NOTE — Anesthesia Postprocedure Evaluation (Signed)
Anesthesia Post Note  Patient: Steven Abbott  Procedure(s) Performed: LEFT TOTAL KNEE ARTHROPLASTY (Left Knee)     Patient location during evaluation: PACU Anesthesia Type: Spinal Level of consciousness: awake and alert Pain management: pain level controlled Vital Signs Assessment: post-procedure vital signs reviewed and stable Respiratory status: spontaneous breathing and respiratory function stable Cardiovascular status: blood pressure returned to baseline and stable Postop Assessment: spinal receding and no apparent nausea or vomiting Anesthetic complications: no    Last Vitals:  Vitals:   02/23/18 1313 02/23/18 1323  BP: 112/82   Pulse: 71 (!) 57  Resp: 14 16  Temp:    SpO2: 100% 100%    Last Pain:  Vitals:   02/23/18 1336  TempSrc:   PainSc: 3                  [name]

## 2018-02-23 NOTE — Op Note (Signed)
PATIENT ID:      Steven Abbott  MRN:     [id number] DOB/AGE:    1971-05-04 / 47 y.o.       OPERATIVE REPORT    DATE OF PROCEDURE:  02/23/2018       PREOPERATIVE DIAGNOSIS:   LEFT KNEE OSTEOARTHRITIS      Estimated body mass index is 32.72 kg/m as calculated from the following:   Height as of 02/09/18:  (1.803 m).   Weight as of 02/09/18: 234 lb 9.6 oz (106.4 kg).                                                        POSTOPERATIVE DIAGNOSIS:   LEFT KNEE OSTEOARTHRITIS                                                                      PROCEDURE:  Procedure(s): LEFT TOTAL KNEE ARTHROPLASTY Using DepuyAttune RP implants #7L Femur, #8Tibia, 7 mm Attune RP bearing, 38 Patella     SURGEON: [name]    ASSISTANT:   Tomi Likens. [employer]   (Present and scrubbed throughout the case, critical for assistance with exposure, retraction, instrumentation, and closure.)         ANESTHESIA: Spinal, 20cc Exparel, 50cc 0.25% Marcaine  EBL: 300cc  FLUID REPLACEMENT: 1500cc crystalloid  TOURNIQUET TIME:  Drains: None  Tranexamic Acid: 1gm IV, 2gm topical  COMPLICATIONS:  None         INDICATIONS FOR PROCEDURE: The patient has  LEFT KNEE OSTEOARTHRITIS, Var deformities, XR shows bone on bone arthritis, lateral subluxation of tibia. Patient has failed all conservative measures including anti-inflammatory medicines, narcotics, attempts at  exercise and weight loss, cortisone injections and viscosupplementation.  Risks and benefits of surgery have been discussed, questions answered.   DESCRIPTION OF PROCEDURE: The patient identified by armband, received  IV antibiotics, in the holding area at [facility] Patient taken to the operating room, appropriate anesthetic  monitors were attached, and Spinal anesthesia was  induced. Tourniquet  applied high to the operative thigh. Lateral post and foot positioner  applied to the table, the lower extremity was then prepped and draped  in  usual sterile fashion from the toes to the tourniquet. Time-out procedure was performed. We began the operation, with the knee flexed 120 degrees, by making the anterior midline incision starting at handbreadth above the patella going over the patella 1 cm medial to and 4 cm distal to the tibial tubercle. Small bleeders in the skin and the  subcutaneous tissue identified and cauterized. Transverse retinaculum was incised and reflected medially and a medial parapatellar arthrotomy was accomplished. the patella was everted and theprepatellar fat pad resected. The superficial medial collateral  ligament was then elevated from anterior to posterior along the proximal  flare of the tibia and anterior half of the menisci resected. The knee was hyperflexed exposing bone on bone arthritis. Peripheral and notch osteophytes as well as the cruciate ligaments were then resected. We continued to  work our way around posteriorly along the proximal  tibia, and externally  rotated the tibia subluxing it out from underneath the femur. A McHale  retractor was placed through the notch and a lateral Hohmann retractor  placed, and we then drilled through the proximal tibia in line with the  axis of the tibia followed by an intramedullary guide rod and 2-degree  posterior slope cutting guide. The tibial cutting guide, 3 degree posterior sloped, was pinned into place allowing resection of 0 mm of bone medially and 10 mm of bone laterally. Satisfied with the tibial resection, we then  entered the distal femur 2 mm anterior to the PCL origin with the  intramedullary guide rod and applied the distal femoral cutting guide  set at 9 mm, with 5 degrees of valgus. This was pinned along the  epicondylar axis. At this point, the distal femoral cut was accomplished without difficulty. We then sized for a #7L femoral component and pinned the guide in 3 degrees of external rotation. The chamfer cutting guide was pinned into place. The  anterior, posterior, and chamfer cuts were accomplished without difficulty followed by  the Attune RP box cutting guide and the box cut. We also removed posterior osteophytes from the posterior femoral condyles. At this  time, the knee was brought into full extension. We checked our  extension and flexion gaps and found them symmetric for a 7 mm bearing. Distracting in extension with a lamina spreader, the posterior horns of the menisci were removed, and Exparel, diluted to 60 cc, with 20cc NS, and 20cc 0.5% Marcaine,was injected into the capsule and synovium of the knee. The posterior patella cut was accomplished with the 9.5 mm Attune cutting guide, sized for a 38mm dome, and the fixation pegs drilled.The knee  was then once again hyperflexed exposing the proximal tibia. We sized for a # 8 tibial base plate, applied the smokestack and the conical reamer followed by the the Delta fin keel punch. We then hammered into place the Attune RP trial femoral component, drilled the lugs, inserted a  7 mm trial bearing, trial patellar button, and took the knee through range of motion from 0-130 degrees. No thumb pressure was required for patellar Tracking. At this point, the limb was wrapped with an Esmarch bandage and the tourniquet inflated to 350 mmHg. All trial components were removed, mating surfaces irrigated with pulse lavage, and dried with suction and sponges. 10 cc of the Exparel solution was applied to the cancellus bone of the patella distal femur and proximal tibia.  After waiting 1 minute, the bony surfaces were again, dried with sponges. A double batch of DePuy HV cement with 1500 mg of Zinacef was mixed and applied to all bony metallic mating surfaces except for the posterior condyles of the femur itself. In order, we hammered into place the tibial tray and removed excess cement, the femoral component and removed excess cement. The final Attune RP bearing  was inserted, and the knee brought to full  extension with compression.  The patellar button was clamped into place, and excess cement  removed. While the cement cured the wound was irrigated out with normal saline solution pulse lavage. Ligament stability and patellar tracking were checked and found to be excellent. The parapatellar arthrotomy was closed with  running #1 Vicryl suture. The subcutaneous tissue with 0 and 2-0 undyed  Vicryl suture, and the skin with running 3-0 SQ vicryl. A dressing of Xeroform,  4 x 4, dressing sponges, Webril, and Ace wrap applied. The patient  awakened, and  taken to recovery room without difficulty.   [name] 02/23/2018, 11:10 AM

## 2018-02-23 NOTE — Discharge Instructions (Signed)

## 2018-02-23 NOTE — Progress Notes (Signed)
Orthopedic Tech Progress Note Patient Details:  Vineeth Fell May 25, 1971 [id number]  Ortho Devices Type of Ortho Device: Bone foam zero knee   Post Interventions Patient Tolerated: Well Instructions Provided: Care of device   Saul Fordyce 02/23/2018, 1:54 PM

## 2018-02-23 NOTE — Anesthesia Procedure Notes (Signed)
Procedure Name: MAC Date/Time: 02/23/2018 9:48 AM Performed by: Lowella Dell, CRNA Pre-anesthesia Checklist: Patient identified, Emergency Drugs available, Suction available, Patient being monitored and Timeout performed Patient Re-evaluated:Patient Re-evaluated prior to induction Oxygen Delivery Method: Simple face mask Induction Type: IV induction Placement Confirmation: positive ETCO2 Dental Injury: Teeth and Oropharynx as per pre-operative assessment

## 2018-02-23 NOTE — Anesthesia Preprocedure Evaluation (Addendum)
Anesthesia Evaluation  Patient identified by MRN, date of birth, ID band Patient awake    Reviewed: Allergy & Precautions, NPO status , Patient's Chart, lab work & pertinent test results, reviewed documented beta blocker date and time   Airway Mallampati: IV  TM Distance: >3 FB Neck ROM: Full    Dental  (+) Dental Advisory Given   Pulmonary former smoker,    breath sounds clear to auscultation       Cardiovascular hypertension, Pt. on home beta blockers and Pt. on medications  Rhythm:Regular Rate:Normal     Neuro/Psych Anxiety Depression negative neurological ROS     GI/Hepatic Neg liver ROS, S/p gastric sleeve   Endo/Other  Obesity  Renal/GU negative Renal ROS  negative genitourinary   Musculoskeletal  (+) Arthritis ,   Abdominal (+) + obese,   Peds  Hematology negative hematology ROS (+)   Anesthesia Other Findings   Reproductive/Obstetrics                            Anesthesia Physical Anesthesia Plan  ASA: II  Anesthesia Plan: Spinal   Post-op Pain Management:  Regional for Post-op pain   Induction:   PONV Risk Score and Plan: Treatment may vary due to age or medical condition and Propofol infusion  Airway Management Planned: Natural Airway and Simple Face Mask  Additional Equipment: None  Intra-op Plan:   Post-operative Plan:   Informed Consent: I have reviewed the patients History and Physical, chart, labs and discussed the procedure including the risks, benefits and alternatives for the proposed anesthesia with the patient or authorized representative who has indicated his/her understanding and acceptance.     Plan Discussed with: CRNA and Anesthesiologist  Anesthesia Plan Comments:         Anesthesia Quick Evaluation

## 2018-02-23 NOTE — Evaluation (Signed)
Physical Therapy Evaluation Patient Details Name: Steven Abbott MRN: [id number] DOB: [birth year] Today's Date: 02/23/2018   History of Present Illness  47 y.o male s/p L TKA 5/24. PMH includes: HTN, prior knee surgery.   Clinical Impression  Patient is s/p above surgery resulting in functional limitations due to the deficits listed below (see PT Problem List). PTA, patient independent living at home with wife with 4 stairs to enter. Upon eval pt presents with mild post op pain and weakness, ambulating with RW half the unit with improving mechanics after cues. Ancipitate patient will do well next PT visit and progress to stair training.   Patient will benefit from skilled PT to increase their independence and safety with mobility to allow discharge to the venue listed below.       Follow Up Recommendations Follow surgeon's recommendation for DC plan and follow-up therapies    Equipment Recommendations  None recommended by PT    Recommendations for Other Services       Precautions / Restrictions Precautions Precautions: Knee;Fall Precaution Booklet Issued: Yes (comment) Precaution Comments: reveiwed supine therex and no pillow unee knee Restrictions Weight Bearing Restrictions: Yes Other Position/Activity Restrictions: WBAT      Mobility  Bed Mobility Overal bed mobility: Modified Independent                Transfers Overall transfer level: Modified independent Equipment used: Rolling walker (2 wheeled)                Ambulation/Gait Ambulation/Gait assistance: Supervision Ambulation Distance (Feet): 200 Feet Assistive device: Rolling walker (2 wheeled) Gait Pattern/deviations: Step-to pattern;Step-through pattern Gait velocity: slight decrease   General Gait Details: 200', supervision, cues for step length and sequencing patient ambulating well without LOB or knee buckle.   Stairs            Wheelchair Mobility    Modified Rankin (Stroke Patients  Only)       Balance Overall balance assessment: Mild deficits observed, not formally tested                                           Pertinent Vitals/Pain Pain Assessment: 0-10 Pain Score: 4  Pain Location: L knee Pain Descriptors / Indicators: Discomfort;Aching Pain Intervention(s): Limited activity within patient's tolerance;Monitored during session;Premedicated before session;Repositioned    Home Living Family/patient expects to be discharged to:: Private residence Living Arrangements: Spouse/significant other Available Help at Discharge: Family Type of Home: House Home Access: Stairs to enter Entrance Stairs-Rails: None Entrance Stairs-Number of Steps: 4 Home Layout: One level Home Equipment: Environmental consultant - 2 wheels;Bedside commode;Cane - quad      Prior Function Level of Independence: Independent               Hand Dominance        Extremity/Trunk Assessment   Upper Extremity Assessment Upper Extremity Assessment: Defer to OT evaluation    Lower Extremity Assessment Lower Extremity Assessment: Overall WFL for tasks assessed       Communication   Communication: No difficulties  Cognition Arousal/Alertness: Awake/alert Behavior During Therapy: WFL for tasks assessed/performed Overall Cognitive Status: Within Functional Limits for tasks assessed  General Comments      Exercises Total Joint Exercises Ankle Circles/Pumps: 20 reps Quad Sets: 10 reps Heel Slides: 10 reps Straight Leg Raises: 10 reps Long Arc Quad: 10 reps   Assessment/Plan    PT Assessment Patient needs continued PT services  PT Problem List Decreased activity tolerance;Decreased range of motion;Decreased strength;Pain       PT Treatment Interventions DME instruction;Gait training;Functional mobility training;Therapeutic activities;Stair training;Balance training;Therapeutic exercise    PT Goals (Current  goals can be found in the Care Plan section)  Acute Rehab PT Goals Patient Stated Goal: go home tomorrow PT Goal Formulation: With patient Time For Goal Achievement: 03/02/18 Potential to Achieve Goals: Good    Frequency 7X/week   Barriers to discharge        Co-evaluation               AM-PAC PT "6 Clicks" Daily Activity  Outcome Measure Difficulty turning over in bed (including adjusting bedclothes, sheets and blankets)?: A Little Difficulty moving from lying on back to sitting on the side of the bed? : A Little Difficulty sitting down on and standing up from a chair with arms (e.g., wheelchair, bedside commode, etc,.)?: A Little Help needed moving to and from a bed to chair (including a wheelchair)?: A Little Help needed walking in hospital room?: A Little Help needed climbing 3-5 steps with a railing? : A Little 6 Click Score: 18    End of Session Equipment Utilized During Treatment: Gait belt Activity Tolerance: Patient tolerated treatment well Patient left: in bed;with call bell/phone within reach Nurse Communication: Mobility status PT Visit Diagnosis: Unsteadiness on feet (R26.81);Pain Pain - Right/Left: Left Pain - part of body: Knee    Time: 1651-1720 PT Time Calculation (min) (ACUTE ONLY): 29 min   Charges:   PT Evaluation $PT Eval Low Complexity: 1 Low PT Treatments $Gait Training: 8-22 mins   PT G Codes:        Etta Grandchild, PT, DPT Acute Rehab Services Pager: [phone]    Etta Grandchild 02/23/2018, 5:27 PM

## 2018-02-23 NOTE — Transfer of Care (Signed)
Immediate Anesthesia Transfer of Care Note  Patient: Steven Abbott  Procedure(s) Performed: LEFT TOTAL KNEE ARTHROPLASTY (Left Knee)  Patient Location: PACU  Anesthesia Type:MAC  Level of Consciousness: awake, alert , oriented and patient cooperative  Airway & Oxygen Therapy: Patient Spontanous Breathing and Patient connected to nasal cannula oxygen  Post-op Assessment: Report given to RN and Post -op Vital signs reviewed and stable  Post vital signs: Reviewed and stable  Last Vitals:  Vitals Value Taken Time  BP    Temp    Pulse 65 02/23/2018 12:03 PM  Resp 23 02/23/2018 12:03 PM  SpO2 100 % 02/23/2018 12:03 PM  Vitals shown include unvalidated device data.  Last Pain:  Vitals:   02/23/18 0900  TempSrc:   PainSc: 0-No pain      Patients Stated Pain Goal: 5 (10/12/01 4961)  Complications: No apparent anesthesia complications

## 2018-02-23 NOTE — Anesthesia Procedure Notes (Signed)
Spinal  Patient location during procedure: OR Start time: 02/23/2018 9:39 AM End time: 02/23/2018 9:45 AM Staffing Anesthesiologist: [name], MD Performed: anesthesiologist  Preanesthetic Checklist Completed: patient identified, surgical consent, pre-op evaluation, timeout performed, IV checked, risks and benefits discussed and monitors and equipment checked Spinal Block Patient position: sitting Prep: DuraPrep Patient monitoring: heart rate, cardiac monitor, continuous pulse ox and blood pressure Approach: midline Location: L2-3 Injection technique: single-shot Needle Needle type: Quincke  Needle gauge: 22 G Additional Notes Functioning IV was confirmed and monitors were applied. Sterile prep and drape, including hand hygiene, mask, and sterile gloves were used. The patient was positioned and the spine was prepped. The skin was anesthetized with lidocaine. Free flow of clear CSF was obtained prior to injecting local anesthetic into the CSF. The spinal needle aspirated freely following injection. The needle was carefully withdrawn. The patient tolerated the procedure well. Consent was obtained prior to the procedure with all questions answered and concerns addressed. Risks including, but not limited to, bleeding, infection, nerve damage, paralysis, failed block, inadequate analgesia, allergic reaction, high spinal, itching, and headache were discussed and the patient wished to proceed.  Leslye Peer, MD

## 2018-02-23 NOTE — Anesthesia Procedure Notes (Signed)
Anesthesia Regional Block: Adductor canal block   Pre-Anesthetic Checklist: ,, timeout performed, Correct Patient, Correct Site, Correct Laterality, Correct Procedure, Correct Position, site marked, Risks and benefits discussed,  Surgical consent,  Pre-op evaluation,  At surgeon's request and post-op pain management  Laterality: Left  Prep: chloraprep       Needles:  Injection technique: Single-shot  Needle Type: Echogenic Needle     Needle Length: 9cm  Needle Gauge: 21     Additional Needles:   Narrative:  Start time: 02/23/2018 8:49 AM End time: 02/23/2018 8:53 AM Injection made incrementally with aspirations every 5 mL.  Performed by: Personally  Anesthesiologist: [name], MD  Additional Notes: No pain on injection. No increased resistance to injection. Injection made in 5cc increments. Good needle visualization. Patient tolerated the procedure well.

## 2018-02-24 LAB — BASIC METABOLIC PANEL WITH GFR
Anion gap: 8 (ref 5–15)
BUN: 13 mg/dL (ref 6–20)
CO2: 27 mmol/L (ref 22–32)
Calcium: 8.7 mg/dL — ABNORMAL LOW (ref 8.9–10.3)
Chloride: 101 mmol/L (ref 101–111)
Creatinine, Ser: 1.02 mg/dL (ref 0.61–1.24)
GFR calc Af Amer: >60 mL/min
GFR calc non Af Amer: >60 mL/min
Glucose, Bld: 146 mg/dL — ABNORMAL HIGH (ref 65–99)
Potassium: 4.2 mmol/L (ref 3.5–5.1)
Sodium: 136 mmol/L (ref 135–145)

## 2018-02-24 LAB — CBC
HCT: 42.1 % (ref 39.0–52.0)
Hemoglobin: 13.6 g/dL (ref 13.0–17.0)
MCH: 29.3 pg (ref 26.0–34.0)
MCHC: 32.3 g/dL (ref 30.0–36.0)
MCV: 90.7 fL (ref 78.0–100.0)
Platelets: 203 10*3/uL (ref 150–400)
RBC: 4.64 MIL/uL (ref 4.22–5.81)
RDW: 12.8 % (ref 11.5–15.5)
WBC: 10.8 10*3/uL — ABNORMAL HIGH (ref 4.0–10.5)

## 2018-02-24 MED ORDER — OXYCODONE HCL 5 MG PO TABS: 5.0000 mg | ORAL_TABLET | ORAL | 0 refills | Status: DC | PRN

## 2018-02-24 NOTE — Progress Notes (Signed)
Physical Therapy Treatment and Discharge Patient Details Name: Steven Abbott MRN: [id number] DOB: 05-20-1971 Today's Date: 02/24/2018    History of Present Illness 47 y.o male s/p L TKA 5/24. PMH includes: HTN, prior knee surgery.     PT Comments    Session focused on stair training with family education, patient performing stairs with RW with proper technique and no LOB, min guard level at this time. Son and wife present, report confidence with assistance. Pt and family educated on safety considerations for home and have no further questions or concerns at this time. Pt has met all functional goals and will benefit from skilled home health PT when medically cleared for d/c.       Follow Up Recommendations  Follow surgeon's recommendation for DC plan and follow-up therapies     Equipment Recommendations  None recommended by PT    Recommendations for Other Services       Precautions / Restrictions Precautions Precautions: Knee;Fall Precaution Booklet Issued: Yes (comment) Precaution Comments: reveiwed supine therex and no pillow unee knee Restrictions Weight Bearing Restrictions: Yes Other Position/Activity Restrictions: WBAT    Mobility  Bed Mobility Overal bed mobility: Modified Independent                Transfers Overall transfer level: Modified independent Equipment used: Rolling walker (2 wheeled)                Ambulation/Gait Ambulation/Gait assistance: Supervision Ambulation Distance (Feet): 100 Feet Assistive device: Rolling walker (2 wheeled) Gait Pattern/deviations: Step-to pattern;Step-through pattern Gait velocity: decreased   General Gait Details: ambulating with less pain this visit, step to pattern will need to improve mechanics with HHPT   Stairs Stairs: Yes Stairs assistance: Supervision;Min guard Stair Management: Backwards;With walker Number of Stairs: 12 General stair comments: family education on real stairwell, patient  performing with min guard/supervision. educated family on proper guarding and assistance, Son and wife present and report confidence with assitance. pt improved stair safety since this AM visit.    Wheelchair Mobility    Modified Rankin (Stroke Patients Only)       Balance Overall balance assessment: Mild deficits observed, not formally tested                                          Cognition Arousal/Alertness: Awake/alert Behavior During Therapy: WFL for tasks assessed/performed Overall Cognitive Status: Within Functional Limits for tasks assessed                                        Exercises Total Joint Exercises Ankle Circles/Pumps: 20 reps    General Comments        Pertinent Vitals/Pain Pain Assessment: 0-10 Pain Score: 8  Pain Location: L knee Pain Descriptors / Indicators: Discomfort;Aching Pain Intervention(s): Limited activity within patient's tolerance;Premedicated before session;Monitored during session;Repositioned    Home Living Family/patient expects to be discharged to:: Private residence Living Arrangements: Spouse/significant other                  Prior Function            PT Goals (current goals can now be found in the care plan section) Acute Rehab PT Goals Patient Stated Goal: go home today PT Goal Formulation: With patient Time For Goal Achievement: 03/02/18  Potential to Achieve Goals: Good Progress towards PT goals: Goals met/education completed, patient discharged from PT    Frequency    7X/week      PT Plan Current plan remains appropriate    Co-evaluation              AM-PAC PT "6 Clicks" Daily Activity  Outcome Measure  Difficulty turning over in bed (including adjusting bedclothes, sheets and blankets)?: A Little Difficulty moving from lying on back to sitting on the side of the bed? : A Little Difficulty sitting down on and standing up from a chair with arms (e.g.,  wheelchair, bedside commode, etc,.)?: A Little Help needed moving to and from a bed to chair (including a wheelchair)?: A Little Help needed walking in hospital room?: A Little Help needed climbing 3-5 steps with a railing? : A Little 6 Click Score: 18    End of Session Equipment Utilized During Treatment: Gait belt Activity Tolerance: Patient tolerated treatment well Patient left: in bed;with call bell/phone within reach Nurse Communication: Mobility status PT Visit Diagnosis: Unsteadiness on feet (R26.81);Pain Pain - Right/Left: Left Pain - part of body: Knee     Time: 5872-7618 PT Time Calculation (min) (ACUTE ONLY): 17 min  Charges:  $Gait Training: 8-22 mins                    G Codes:       Reinaldo Berber, PT, DPT Acute Rehab Services Pager: [phone]     Reinaldo Berber 02/24/2018, 11:30 AM

## 2018-02-24 NOTE — Discharge Summary (Signed)
Patient ID: Steven Abbott MRN: [id number] DOB/AGE: 46-Nov-1972 46 y.o.  Admit date: 02/23/2018 Discharge date: 02/24/2018  Admission Diagnoses:  Principal Problem:   Degenerative arthritis of left knee Active Problems:   Primary osteoarthritis of left knee   Discharge Diagnoses:  Same  Past Medical History:  Diagnosis Date  . ADHD   . Anxiety   . Arthritis   . Depression   . Hypertension     Surgeries: Procedure(s): LEFT TOTAL KNEE ARTHROPLASTY on 02/23/2018   Consultants:   Discharged Condition: Improved  Hospital Course: Steven Abbott is an 47 y.o. male who was admitted 02/23/2018 for operative treatment ofDegenerative arthritis of left knee. Patient has severe unremitting pain that affects sleep, daily activities, and work/hobbies. After pre-op clearance the patient was taken to the operating room on 02/23/2018 and underwent  Procedure(s): LEFT TOTAL KNEE ARTHROPLASTY.    Patient was given perioperative antibiotics:  Anti-infectives (From admission, onward)   Start     Dose/Rate Route Frequency Ordered Stop   02/23/18 0745  ceFAZolin (ANCEF) IVPB 2g/100 mL premix     2 g 200 mL/hr over 30 Minutes Intravenous On call to O.R. 02/23/18 1324 02/23/18 0946   02/23/18 0734  ceFAZolin (ANCEF) 2-4 GM/100ML-% IVPB    Note to Pharmacy:  Domenica Fail   : cabinet override      02/23/18 0734 02/23/18 0946       Patient was given sequential compression devices, early ambulation, and chemoprophylaxis to prevent DVT.  Patient benefited maximally from hospital stay and there were no complications.    Recent vital signs:  Patient Vitals for the past 24 hrs:  BP Temp Temp src Pulse Resp SpO2  02/24/18 0745 - 99.5 F (37.5 C) Oral - - -  02/24/18 0642 136/78 (!) 101.3 F (38.5 C) Oral 62 - 100 %  02/24/18 0026 129/63 (!) 101.6 F (38.7 C) Oral 95 - 100 %  02/23/18 2102 112/80 100.3 F (37.9 C) Oral 76 - 100 %  02/23/18 1548 109/74 98.2 F (36.8 C) Oral 71 20 94 %   02/23/18 1500 104/67 - - 63 14 100 %  02/23/18 1440 107/65 97.6 F (36.4 C) - 75 16 100 %  02/23/18 1345 107/69 - - - - -  02/23/18 1323 - - - (!) 57 16 100 %  02/23/18 1313 112/82 - - 71 14 100 %  02/23/18 1312 (!) 58/31 - - 71 18 100 %  02/23/18 1240 109/70 - - 65 (!) 23 100 %  02/23/18 1210 - (!) 97 F (36.1 C) - - - -  02/23/18 0905 129/60 - - 73 17 100 %  02/23/18 0900 112/81 - - 76 16 100 %  02/23/18 0855 109/65 - - 68 18 100 %  02/23/18 0850 121/73 - - (!) 57 13 100 %     Recent laboratory studies:  Recent Labs    02/24/18 0349  WBC 10.8*  HGB 13.6  HCT 42.1  PLT 203  NA 136  K 4.2  CL 101  CO2 27  BUN 13  CREATININE 1.02  GLUCOSE 146*  CALCIUM 8.7*     Discharge Medications:   Allergies as of 02/24/2018   No Active Allergies     Medication List    STOP taking these medications   acetaminophen 650 MG CR tablet Commonly known as:  TYLENOL   lidocaine 4 % cream Commonly known as:  LMX   oxyCODONE-acetaminophen 5-325 MG tablet Commonly known as:  PERCOCET/ROXICET  TAKE these medications   amphetamine-dextroamphetamine 20 MG tablet Commonly known as:  ADDERALL Take 20 mg by mouth 2 (two) times daily.   aspirin EC 81 MG tablet Take 1 tablet (81 mg total) by mouth 2 (two) times daily.   B-12 1000 MCG Subl Place 1,000 mcg under the tongue daily.   buPROPion 300 MG 24 hr tablet Commonly known as:  WELLBUTRIN XL Take 300 mg by mouth daily.   busPIRone 10 MG tablet Commonly known as:  BUSPAR Take 10 mg by mouth 3 (three) times daily.   celecoxib 200 MG capsule Commonly known as:  CELEBREX Take 1 capsule (200 mg total) by mouth 2 (two) times daily.   dicyclomine 10 MG capsule Commonly known as:  BENTYL Take 10 mg by mouth 2 (two) times daily.   gabapentin 300 MG capsule Commonly known as:  NEURONTIN Take 1 capsule (300 mg total) by mouth 3 (three) times daily.   Melatonin 10 MG Tabs Take 30 mg by mouth at bedtime as needed  (sleep).   methocarbamol 500 MG tablet Commonly known as:  ROBAXIN Take 1 tablet (500 mg total) by mouth 2 (two) times daily with a meal.   metoprolol succinate 50 MG 24 hr tablet Commonly known as:  TOPROL-XL Take 50 mg by mouth daily. Take with or immediately following a meal.   multivitamin with minerals Tabs tablet Take 1 tablet by mouth 2 (two) times daily.   oxyCODONE 5 MG immediate release tablet Commonly known as:  ROXICODONE Take 1-3 tablets (5-15 mg total) by mouth every 4 (four) hours as needed.   ranitidine 150 MG capsule Commonly known as:  ZANTAC Take 150 mg by mouth 2 (two) times daily.   SLOW IRON PO Take 1 tablet by mouth daily.   testosterone enanthate 200 MG/ML injection Commonly known as:  DELATESTRYL Inject 200 mg into the muscle every 14 (fourteen) days. For IM use only   VITAMIN D3 PO Take 1 capsule by mouth daily.            Durable Medical Equipment  (From admission, onward)        Start     Ordered   02/23/18 1506  DME Walker rolling  Once    Question:  Patient needs a walker to treat with the following condition  Answer:  Status post total left knee replacement   02/23/18 1505   02/23/18 1506  DME 3 n 1  Once     02/23/18 1505      Diagnostic Studies: Dg Chest 2 View  Result Date: 02/10/2018 CLINICAL DATA:  Left knee surgery scheduled for May 24th. EXAM: CHEST - 2 VIEW COMPARISON:  None. FINDINGS: The heart size and mediastinal contours are within normal limits. Both lungs are clear. The visualized skeletal structures are unremarkable. IMPRESSION: No active cardiopulmonary disease. Electronically Signed   By: Gerome Sam III M.D   On: 02/10/2018 07:45    Disposition: Discharge disposition: 01-Home or Self Care       Discharge Instructions    Call MD / Call 911   Complete by:  As directed    If you experience chest pain or shortness of breath, CALL 911 and be transported to the hospital emergency room.  If you develope a  fever above 101 F, pus (white drainage) or increased drainage or redness at the wound, or calf pain, call your surgeon's office.   Constipation Prevention   Complete by:  As directed    Drink  plenty of fluids.  Prune juice may be helpful.  You may use a stool softener, such as Colace (over the counter) 100 mg twice a day.  Use MiraLax (over the counter) for constipation as needed.   Diet - low sodium heart healthy   Complete by:  As directed    Increase activity slowly as tolerated   Complete by:  As directed       Follow-up Information    Gean Birchwood, MD In 2 weeks.   Specialty:  Orthopedic Surgery Contact information: [address] [place] [address] [phone]            Signed: Jiles Harold 02/24/2018, 8:36 AM

## 2018-02-24 NOTE — Progress Notes (Signed)
Physical Therapy Treatment Patient Details Name: Steven Abbott MRN: [id number] DOB: 11-Sep-1971 Today's Date: 02/24/2018    History of Present Illness 47 y.o male s/p L TKA 5/24. PMH includes: HTN, prior knee surgery.     PT Comments    Patient limited by pain this visit. Introduced Museum/gallery curator, patient requiring hands on assistance due to pain. Will plan to see patient once more when pain is better controlled to ensure safety for home return if medically ready for d/c today. Anticipate he will do well.     Follow surgeon's recommendation for DC plan and follow-up therapies     Equipment Recommendations  None recommended by PT    Recommendations for Other Services       Precautions / Restrictions Precautions Precautions: Knee;Fall Precaution Booklet Issued: Yes (comment) Precaution Comments: reveiwed supine therex and no pillow unee knee Restrictions Weight Bearing Restrictions: Yes Other Position/Activity Restrictions: WBAT    Mobility  Bed Mobility Overal bed mobility: Modified Independent                Transfers Overall transfer level: Modified independent Equipment used: Rolling walker (2 wheeled)                Ambulation/Gait Ambulation/Gait assistance: Supervision Ambulation Distance (Feet): 100 Feet Assistive device: Rolling walker (2 wheeled) Gait Pattern/deviations: Step-to pattern;Step-through pattern Gait velocity: decreased   General Gait Details: patient with increased pain today, limited ambulation. unable to progress mechanics from yesterday   Stairs Stairs: Yes Stairs assistance: Min assist Stair Management: Backwards;With walker Number of Stairs: 8 General stair comments: patient introduced to stairs with RW, cues for techinque and safety. pt with 1-2 LOB due to pain, requring min A to stabilize. will work on stairs once more today to ensure safety for home entry.    Wheelchair Mobility    Modified Rankin (Stroke  Patients Only)       Balance Overall balance assessment: Mild deficits observed, not formally tested                                          Cognition Arousal/Alertness: Awake/alert Behavior During Therapy: WFL for tasks assessed/performed Overall Cognitive Status: Within Functional Limits for tasks assessed                                        Exercises      General Comments        Pertinent Vitals/Pain Pain Assessment: 0-10 Pain Score: 8  Pain Location: L knee Pain Descriptors / Indicators: Discomfort;Aching Pain Intervention(s): Limited activity within patient's tolerance;Monitored during session;Premedicated before session;Repositioned    Home Living                      Prior Function            PT Goals (current goals can now be found in the care plan section) Acute Rehab PT Goals Patient Stated Goal: go home today PT Goal Formulation: With patient Time For Goal Achievement: 03/02/18 Potential to Achieve Goals: Good    Frequency    7X/week      PT Plan Current plan remains appropriate    Co-evaluation              AM-PAC PT "6 Clicks" Daily Activity  Outcome Measure  Difficulty turning over in bed (including adjusting bedclothes, sheets and blankets)?: A Little Difficulty moving from lying on back to sitting on the side of the bed? : A Little Difficulty sitting down on and standing up from a chair with arms (e.g., wheelchair, bedside commode, etc,.)?: A Little Help needed moving to and from a bed to chair (including a wheelchair)?: A Little Help needed walking in hospital room?: A Little Help needed climbing 3-5 steps with a railing? : A Little 6 Click Score: 18    End of Session Equipment Utilized During Treatment: Gait belt Activity Tolerance: Patient tolerated treatment well Patient left: in bed;with call bell/phone within reach Nurse Communication: Mobility status PT Visit Diagnosis:  Unsteadiness on feet (R26.81);Pain Pain - Right/Left: Left Pain - part of body: Knee     Time: 4098-1191 PT Time Calculation (min) (ACUTE ONLY): 16 min  Charges:  $Gait Training: 8-22 mins                    G Codes:       Etta Grandchild, PT, DPT Acute Rehab Services Pager: [phone]    Etta Grandchild 02/24/2018, 8:16 AM

## 2018-02-24 NOTE — Progress Notes (Signed)
   PATIENT ID: Steven Abbott   1 Day Post-Op Procedure(s) (LRB): LEFT TOTAL KNEE ARTHROPLASTY (Left)  Subjective: Had trouble getting pain controlled last night. Takes oxycodone  at home q 6 hrs prn pain at baseline. Doing better this am. Up with pt walking in halls, hopes to go home today.   Objective:  Vitals:   02/24/18 0642 02/24/18 0745  BP: 136/78   Pulse: 62   Resp:    Temp: (!) 101.3 F (38.5 C) 99.5 F (37.5 C)  SpO2: 100%      L knee ace c/d/i Wiggles toes, distally NVI bilat Calf soft nontender  Labs:  Recent Labs    02/24/18 0349  HGB 13.6   Recent Labs    02/24/18 0349  WBC 10.8*  RBC 4.64  HCT 42.1  PLT 203   Recent Labs    02/24/18 0349  NA 136  K 4.2  CL 101  CO2 27  BUN 13  CREATININE 1.02  GLUCOSE 146*  CALCIUM 8.7*    Assessment and Plan: 1 day s/p left TKA Up with PT, once cleared by PT can d/c home Orders in chart Scripts signed Fu with Dr. Turner Daniels  VTE proph: asa, scds

## 2018-02-24 NOTE — Progress Notes (Signed)
Reviewed discharge paperwork and medications with Steven Abbott and his wife with full understanding

## 2018-02-26 ENCOUNTER — Encounter (HOSPITAL_COMMUNITY): Payer: Self-pay | Admitting: Orthopedic Surgery

## 2019-08-26 ENCOUNTER — Other Ambulatory Visit (HOSPITAL_COMMUNITY): Payer: Self-pay | Admitting: Orthopedic Surgery

## 2019-08-26 DIAGNOSIS — M25562 Pain in left knee: Secondary | ICD-10-CM

## 2019-09-05 ENCOUNTER — Encounter (HOSPITAL_COMMUNITY)
Admission: RE | Admit: 2019-09-05 | Discharge: 2019-09-05 | Disposition: A | Discharge status: Home / Self Care | Payer: No Typology Code available for payment source | Source: Ambulatory Visit | Attending: Orthopedic Surgery | Admitting: Orthopedic Surgery

## 2019-09-05 ENCOUNTER — Other Ambulatory Visit: Payer: Self-pay

## 2019-09-05 DIAGNOSIS — M25562 Pain in left knee: Secondary | ICD-10-CM

## 2019-09-05 MED ORDER — TECHNETIUM TC 99M MEDRONATE IV KIT
20.0000 | PACK | Freq: Once | INTRAVENOUS | Status: AC | PRN
  Administered 2019-09-05: 20 via INTRAVENOUS

## 2019-09-19 ENCOUNTER — Other Ambulatory Visit: Payer: Self-pay | Admitting: Orthopedic Surgery

## 2019-10-14 NOTE — Patient Instructions (Signed)
DUE TO COVID-19 ONLY ONE VISITOR IS ALLOWED TO COME WITH YOU AND STAY IN THE WAITING ROOM ONLY DURING PRE OP AND PROCEDURE DAY OF SURGERY. THE 1 VISITOR MAY VISIT WITH YOU AFTER SURGERY IN YOUR PRIVATE ROOM DURING VISITING HOURS ONLY!  YOU NEED TO HAVE A COVID 19 TEST ON_1/14/21______ @__11 :30_____, THIS TEST MUST BE DONE BEFORE SURGERY, COME  [address], Steven Abbott , .  [facility])  ONCE YOUR COVID TEST IS COMPLETED, PLEASE BEGIN THE QUARANTINE INSTRUCTIONS AS OUTLINED IN YOUR HANDOUT.                Steven Abbott   Your procedure is scheduled on: 10/21/19   Report to [facility] Main  Entrance   Report to admitting at  9:40 AM     Call this number if you have problems the morning of surgery [phone]    BRUSH YOUR TEETH MORNING OF SURGERY AND RINSE YOUR MOUTH OUT, NO CHEWING GUM CANDY OR MINTS.   Do not eat food After Midnight.    YOU MAY HAVE CLEAR LIQUIDS FROM MIDNIGHT UNTIL 9:00 AM    CLEAR LIQUID DIET   Foods Allowed                                                                     Foods Excluded  Coffee and tea, regular and decaf                             liquids that you cannot  Plain Jell-O any favor except red or purple                                           see through such as: Fruit ices (not with fruit pulp)                                     milk, soups, orange juice  Iced Popsicles                                    All solid food Carbonated beverages, regular and diet                                    Cranberry, grape and apple juices Sports drinks like Gatorade Lightly seasoned clear broth or consume(fat free) Sugar, honey syrup   Please finish the prescribed Pre-Surgery  Drink at 9:00 AM   Nothing by mouth after you finish the  drink !   Take these medicines the morning of surgery with A SIP OF WATER: Buspirone, Gabapentin, Lexapro, Trazadone, Metoprolol, Bentyl                      You may not have any metal on your body including  piercings  Do not wear jewelry,  lotions, powders or  deodorant              Men may shave face and neck.   Do not bring valuables to the hospital. Steven Abbott.  Contacts, dentures or bridgework may not be worn into surgery.   m.   Patients discharged the day of surgery will not be allowed to drive home.   IF YOU ARE HAVING SURGERY AND GOING HOME THE SAME DAY, YOU MUST HAVE AN ADULT TO DRIVE YOU HOME AND BE WITH YOU FOR 24 HOURS.   YOU MAY GO HOME BY TAXI OR UBER OR ORTHERWISE, BUT AN ADULT MUST ACCOMPANY YOU HOME AND STAY WITH YOU FOR 24 HOURS.  Name and phone number of your driver:  Special Instructions: N/A              Please read over the following fact sheets you were given: _____________________________________________________________________             [facility] - Preparing for Surgery  Before surgery, you can play an important role.   Because skin is not sterile, your skin needs to be as free of germs as possible.   You can reduce the number of germs on your skin by washing with CHG (chlorahexidine gluconate) soap before surgery.   CHG is an antiseptic cleaner which kills germs and bonds with the skin to continue killing germs even after washing. Please DO NOT use if you have an allergy to CHG or antibacterial soaps.   If your skin becomes reddened/irritated stop using the CHG and inform your nurse when you arrive at Short Stay.   You may shave your face/neck.  Please follow these instructions carefully:  1.  Shower with CHG Soap the night before surgery and the  morning of Surgery.  2.  If you choose to wash your hair, wash your hair first as usual with your  normal  shampoo.  3.  After you shampoo, rinse your hair and body thoroughly to remove the  shampoo.                                        4.  Use CHG as you would any other liquid soap.  You can  apply chg directly  to the skin and wash                       Gently with a scrungie or clean washcloth.  5.  Apply the CHG Soap to your body ONLY FROM THE NECK DOWN.   Do not use on face/ open                           Wound or open sores. Avoid contact with eyes, ears mouth and genitals (private parts).                       Wash face,  Genitals (private parts) with your normal soap.             6.  Wash thoroughly, paying special attention to the area where your surgery  will be performed.  7.  Thoroughly rinse your body with warm water from  the neck down.  8.  DO NOT shower/wash with your normal soap after using and rinsing off  the CHG Soap.             9.  Pat yourself dry with a clean towel.            10.  Wear clean pajamas.            11.  Place clean sheets on your bed the night of your first shower and do not  sleep with pets. Day of Surgery : Do not apply any lotions/deodorants the morning of surgery.  Please wear clean clothes to the hospital/surgery center.  FAILURE TO FOLLOW THESE INSTRUCTIONS MAY RESULT IN THE CANCELLATION OF YOUR SURGERY PATIENT SIGNATURE_________________________________  NURSE SIGNATURE__________________________________  ________________________________________________________________________   [name]  An incentive spirometer is a tool that can help keep your lungs clear and active. This tool measures how well you are filling your lungs with each breath. Taking long deep breaths may help reverse or decrease the chance of developing breathing (pulmonary) problems (especially infection) following:  A long period of time when you are unable to move or be active. BEFORE THE PROCEDURE   If the spirometer includes an indicator to show your best effort, your nurse or respiratory therapist will set it to a desired goal.  If possible, sit up straight or lean slightly forward. Try not to slouch.  Hold the incentive spirometer in an upright  position. INSTRUCTIONS FOR USE  1. Sit on the edge of your bed if possible, or sit up as far as you can in bed or on a chair. 2. Hold the incentive spirometer in an upright position. 3. Breathe out normally. 4. Place the mouthpiece in your mouth and seal your lips tightly around it. 5. Breathe in slowly and as deeply as possible, raising the piston or the ball toward the top of the column. 6. Hold your breath for 3-5 seconds or for as long as possible. Allow the piston or ball to fall to the bottom of the column. 7. Remove the mouthpiece from your mouth and breathe out normally. 8. Rest for a few seconds and repeat Steps 1 through 7 at least 10 times every 1-2 hours when you are awake. Take your time and take a few normal breaths between deep breaths. 9. The spirometer may include an indicator to show your best effort. Use the indicator as a goal to work toward during each repetition. 10. After each set of 10 deep breaths, practice coughing to be sure your lungs are clear. If you have an incision (the cut made at the time of surgery), support your incision when coughing by placing a pillow or rolled up towels firmly against it. Once you are able to get out of bed, walk around indoors and cough well. You may stop using the incentive spirometer when instructed by your caregiver.  RISKS AND COMPLICATIONS  Take your time so you do not get dizzy or light-headed.  If you are in pain, you may need to take or ask for pain medication before doing incentive spirometry. It is harder to take a deep breath if you are having pain. AFTER USE  Rest and breathe slowly and easily.  It can be helpful to keep track of a log of your progress. Your caregiver can provide you with a simple table to help with this. If you are using the spirometer at home, follow these instructions: SEEK MEDICAL CARE IF:  You are having difficultly using the spirometer.  You have trouble using the spirometer as often as  instructed.  Your pain medication is not giving enough relief while using the spirometer.  You develop fever of 100.5 F (38.1 C) or higher. SEEK IMMEDIATE MEDICAL CARE IF:   You cough up bloody sputum that had not been present before.  You develop fever of 102 F (38.9 C) or greater.  You develop worsening pain at or near the incision site. MAKE SURE YOU:   Understand these instructions.  Will watch your condition.  Will get help right away if you are not doing well or get worse. Document Released: 01/30/2007 Document Revised: 12/12/2011 Document Reviewed: 04/02/2007 [facility] Patient Information 2014 [place], [place].   ________________________________________________________________________

## 2019-10-16 ENCOUNTER — Encounter (HOSPITAL_COMMUNITY): Payer: Self-pay

## 2019-10-16 ENCOUNTER — Encounter (HOSPITAL_COMMUNITY)
Admission: RE | Admit: 2019-10-16 | Discharge: 2019-10-16 | Disposition: A | Discharge status: Home / Self Care | Payer: Self-pay | Source: Ambulatory Visit | Attending: Orthopedic Surgery | Admitting: Orthopedic Surgery

## 2019-10-16 ENCOUNTER — Other Ambulatory Visit: Payer: Self-pay

## 2019-10-16 DIAGNOSIS — Z01818 Encounter for other preprocedural examination: Secondary | ICD-10-CM | POA: Insufficient documentation

## 2019-10-16 NOTE — Progress Notes (Signed)
PCP - Dr. Franchot Erichsen Cardiologist - Dr. Elvera Lennox. [place]  Chest x-ray -  10/17/19 EKG - 09/19/19 Stress Test - no ECHO - no Cardiac Cath - no  Sleep Study - no CPAP -   Fasting Blood Sugar - NA Checks Blood Sugar _____ times a day  Blood Thinner Instructions:NA Aspirin Instructions: Last Dose:  Anesthesia review:   Patient denies shortness of breath, fever, cough and chest pain at PAT appointment yes  Patient verbalized understanding of instructions that were given to them at the PAT appointment. Patient was also instructed that they will need to review over the PAT instructions again at home before surgery. yes

## 2019-10-17 ENCOUNTER — Other Ambulatory Visit (HOSPITAL_COMMUNITY)
Admission: RE | Admit: 2019-10-17 | Discharge: 2019-10-17 | Disposition: A | Discharge status: Home / Self Care | Payer: No Typology Code available for payment source | Source: Ambulatory Visit | Attending: Orthopedic Surgery | Admitting: Orthopedic Surgery

## 2019-10-17 ENCOUNTER — Other Ambulatory Visit (HOSPITAL_COMMUNITY): Payer: Self-pay

## 2019-10-17 ENCOUNTER — Ambulatory Visit (HOSPITAL_COMMUNITY)
Admission: RE | Admit: 2019-10-17 | Discharge: 2019-10-17 | Disposition: A | Discharge status: Home / Self Care | Payer: No Typology Code available for payment source | Source: Ambulatory Visit | Attending: Orthopedic Surgery | Admitting: Orthopedic Surgery

## 2019-10-17 ENCOUNTER — Encounter (HOSPITAL_COMMUNITY)
Admission: RE | Admit: 2019-10-17 | Discharge: 2019-10-17 | Disposition: A | Discharge status: Home / Self Care | Payer: No Typology Code available for payment source | Source: Ambulatory Visit | Attending: Orthopedic Surgery | Admitting: Orthopedic Surgery

## 2019-10-17 DIAGNOSIS — Z20822 Contact with and (suspected) exposure to covid-19: Secondary | ICD-10-CM | POA: Diagnosis not present

## 2019-10-17 DIAGNOSIS — Z01818 Encounter for other preprocedural examination: Secondary | ICD-10-CM | POA: Diagnosis not present

## 2019-10-17 LAB — CBC WITH DIFFERENTIAL/PLATELET
Abs Immature Granulocytes: 0.01 10*3/uL (ref 0.00–0.07)
Basophils Absolute: 0 10*3/uL (ref 0.0–0.1)
Basophils Relative: 0 %
Eosinophils Absolute: 0.2 10*3/uL (ref 0.0–0.5)
Eosinophils Relative: 3 %
HCT: 49.3 % (ref 39.0–52.0)
Hemoglobin: 15.8 g/dL (ref 13.0–17.0)
Immature Granulocytes: 0 %
Lymphocytes Relative: 35 %
Lymphs Abs: 2.4 10*3/uL (ref 0.7–4.0)
MCH: 29.1 pg (ref 26.0–34.0)
MCHC: 32 g/dL (ref 30.0–36.0)
MCV: 90.8 fL (ref 80.0–100.0)
Monocytes Absolute: 0.8 10*3/uL (ref 0.1–1.0)
Monocytes Relative: 12 %
Neutro Abs: 3.3 10*3/uL (ref 1.7–7.7)
Neutrophils Relative %: 50 %
Platelets: 267 10*3/uL (ref 150–400)
RBC: 5.43 MIL/uL (ref 4.22–5.81)
RDW: 13.6 % (ref 11.5–15.5)
WBC: 6.7 10*3/uL (ref 4.0–10.5)
nRBC: 0 % (ref 0.0–0.2)

## 2019-10-17 LAB — URINALYSIS, ROUTINE W REFLEX MICROSCOPIC
Bilirubin Urine: NEGATIVE
Glucose, UA: NEGATIVE mg/dL
Hgb urine dipstick: NEGATIVE
Ketones, ur: NEGATIVE mg/dL
Leukocytes,Ua: NEGATIVE
Nitrite: NEGATIVE
Protein, ur: NEGATIVE mg/dL
Specific Gravity, Urine: 1.029 (ref 1.005–1.030)
pH: 5 (ref 5.0–8.0)

## 2019-10-17 LAB — BASIC METABOLIC PANEL
Anion gap: 7 (ref 5–15)
BUN: 31 mg/dL — ABNORMAL HIGH (ref 6–20)
CO2: 27 mmol/L (ref 22–32)
Calcium: 9.1 mg/dL (ref 8.9–10.3)
Chloride: 106 mmol/L (ref 98–111)
Creatinine, Ser: 1.13 mg/dL (ref 0.61–1.24)
GFR calc Af Amer: >60 mL/min (ref >60)
GFR calc non Af Amer: >60 mL/min (ref >60)
Glucose, Bld: 100 mg/dL — ABNORMAL HIGH (ref 70–99)
Potassium: 4.3 mmol/L (ref 3.5–5.1)
Sodium: 140 mmol/L (ref 135–145)

## 2019-10-17 LAB — APTT: aPTT: 29 seconds (ref 24–36)

## 2019-10-17 LAB — PROTIME-INR
INR: 0.9 (ref 0.8–1.2)
Prothrombin Time: 11.9 seconds (ref 11.4–15.2)

## 2019-10-17 LAB — ABO/RH: ABO/RH(D): O NEG

## 2019-10-17 LAB — SURGICAL PCR SCREEN
MRSA, PCR: NEGATIVE
Staphylococcus aureus: NEGATIVE

## 2019-10-18 DIAGNOSIS — T8484XA Pain due to internal orthopedic prosthetic devices, implants and grafts, initial encounter: Secondary | ICD-10-CM | POA: Diagnosis present

## 2019-10-18 DIAGNOSIS — Z96652 Presence of left artificial knee joint: Secondary | ICD-10-CM | POA: Diagnosis present

## 2019-10-18 LAB — NOVEL CORONAVIRUS, NAA (HOSP ORDER, SEND-OUT TO REF LAB; TAT 18-24 HRS): SARS-CoV-2, NAA: NOT DETECTED

## 2019-10-18 NOTE — H&P (Signed)
TOTAL KNEE REVISION ADMISSION H&P  Patient is being admitted for left revision total knee arthroplasty.  Subjective:  Chief Complaint:left knee pain.  HPI: Steven Abbott, 49 y.o. male, has a history of pain and functional disability in the left knee(s) due to failed previous arthroplasty and patient has failed non-surgical conservative treatments for greater than 12 weeks to include NSAID's and/or analgesics, corticosteriod injections, flexibility and strengthening excercises, weight reduction as appropriate and activity modification. The indications for the revision of the total knee arthroplasty are loosening of one or more components. Onset of symptoms was gradual starting 1 years ago with gradually worsening course since that time.  Prior procedures on the left knee(s) include arthroplasty.  Patient currently rates pain in the left knee(s) at 10 out of 10 with activity. There is night pain, worsening of pain with activity and weight bearing, pain that interferes with activities of daily living and joint swelling.  Patient has evidence of prosthetic loosening by imaging studies. This condition presents safety issues increasing the risk of falls.   There is no current active infection.  Patient Active Problem List   Diagnosis Date Noted  . Primary osteoarthritis of left knee 02/23/2018  . Degenerative arthritis of left knee 02/20/2018   Past Medical History:  Diagnosis Date  . ADHD   . Anxiety   . Arthritis   . Depression   . Hypertension     Past Surgical History:  Procedure Laterality Date  . CHOLECYSTECTOMY  12/2007  . KNEE ARTHROSCOPY  12/2010   "cleaned it out"  . KNEE ARTHROSCOPY  12/2014   MCL  . LAPAROSCOPIC GASTRIC SLEEVE RESECTION  2013  . TOTAL KNEE ARTHROPLASTY Left 02/23/2018   Procedure: LEFT TOTAL KNEE ARTHROPLASTY;  Surgeon: Gean Birchwood, MD;  Location: [facility] OR;  Service: Orthopedics;  Laterality: Left;    No current facility-administered medications for this  encounter.   Current Outpatient Medications  Medication Sig Dispense Refill Last Dose  . amphetamine-dextroamphetamine (ADDERALL) 20 MG tablet Take 20 mg by mouth 2 (two) times daily.     . busPIRone (BUSPAR) 10 MG tablet Take 10 mg by mouth 3 (three) times daily.     . celecoxib (CELEBREX) 200 MG capsule Take 200 mg by mouth 2 (two) times daily.     . cholecalciferol (VITAMIN D) 25 MCG (1000 UT) tablet Take 1,000 Units by mouth daily.      . Cyanocobalamin (B-12) 1000 MCG SUBL Place 1,000 mcg under the tongue daily.     . cyclobenzaprine (FLEXERIL) 10 MG tablet Take 10 mg by mouth daily as needed for muscle spasms.     Marland Kitchen dicyclomine (BENTYL) 10 MG capsule Take 10 mg by mouth daily.      Marland Kitchen escitalopram (LEXAPRO) 10 MG tablet Take 10 mg by mouth daily.     . Fremanezumab-vfrm (AJOVY) 225 MG/1.5ML SOAJ Inject 225 mg into the muscle every 30 (thirty) days.     . Melatonin 10 MG TABS Take 10 mg by mouth at bedtime.      . metoprolol succinate (TOPROL-XL) 50 MG 24 hr tablet Take 50 mg by mouth daily. Take with or immediately following a meal.     . Multiple Vitamin (MULTIVITAMIN WITH MINERALS) TABS tablet Take 1 tablet by mouth daily.      . Multiple Vitamins-Minerals (AIRBORNE PO) Take 1 tablet by mouth daily.     Marland Kitchen oxyCODONE-acetaminophen (PERCOCET) 10-325 MG tablet Take 1 tablet by mouth every 6 (six) hours as needed for pain.      Marland Kitchen  pantoprazole (PROTONIX) 40 MG tablet Take 40 mg by mouth at bedtime.     Marland Kitchen testosterone enanthate (DELATESTRYL) 200 MG/ML injection Inject 200 mg into the muscle every 14 (fourteen) days. For IM use only     . traZODone (DESYREL) 50 MG tablet Take 50 mg by mouth at bedtime.     . vitamin E 400 UNIT capsule Take 400 Units by mouth daily.     Marland Kitchen aspirin EC 81 MG tablet Take 1 tablet (81 mg total) by mouth 2 (two) times daily. (Patient not taking: Reported on 10/11/2019) 60 tablet 0 Not Taking at Unknown time  . gabapentin (NEURONTIN) 300 MG capsule Take 1 capsule (300 mg  total) by mouth 3 (three) times daily. 90 capsule 2   . methocarbamol (ROBAXIN) 500 MG tablet Take 1 tablet (500 mg total) by mouth 2 (two) times daily with a meal. (Patient not taking: Reported on 10/11/2019) 60 tablet 0 Not Taking at Unknown time  . oxyCODONE (ROXICODONE) 5 MG immediate release tablet Take 1-3 tablets (5-15 mg total) by mouth every 4 (four) hours as needed. (Patient not taking: Reported on 10/11/2019) 50 tablet 0 Not Taking at Unknown time   No Known Allergies  Social History   Tobacco Use  . Smoking status: Former Smoker    Years: 9.00    Quit date: 1998    Years since quitting: 23.0  . Smokeless tobacco: Never Used  Substance Use Topics  . Alcohol use: Not Currently    Family History  Problem Relation Age of Onset  . Arthritis Mother   . Diabetes Father   . Stroke Father   . Microcephaly Father   . Prostate cancer Father       Review of Systems  Constitutional: Negative.   HENT: Negative.   Eyes: Negative.   Respiratory: Negative.   Cardiovascular:       HTN  Gastrointestinal:       Heartburn  Endocrine: Negative.   Genitourinary: Negative.   Musculoskeletal: Positive for arthralgias.  Neurological: Negative.   Hematological: Negative.   Psychiatric/Behavioral:       Depression     Objective:  Physical Exam  Vital signs in last 24 hours:    Labs:  Estimated body mass index is 30.96 kg/m as calculated from the following:   Height as of 10/17/19: 5\' 11"  (1.803 m).   Weight as of 10/17/19: 100.7 kg.  Imaging Review Plain radiographs demonstrate  AP lateral and sunrise x-rays of the left total knee show well-placed well fixed Depuy attune prostheses no evidence of loosening and a normal-appearing cemented.  A bone scan has been accomplished that showed some evidence of mild synovitis and some possible loosening on the lateral side of the tibial and/or femoral component   Assessment/Plan:  End stage arthritis, left knee(s) with failed  previous arthroplasty.   The patient history, physical examination, clinical judgment of the provider and imaging studies are consistent with end stage degenerative joint disease of the left knee(s), previous total knee arthroplasty. Revision total knee arthroplasty is deemed medically necessary. The treatment options including medical management, injection therapy, arthroscopy and revision arthroplasty were discussed at length. The risks and benefits of revision total knee arthroplasty were presented and reviewed. The risks due to aseptic loosening, infection, stiffness, patella tracking problems, thromboembolic complications and other imponderables were discussed. The patient acknowledged the explanation, agreed to proceed with the plan and consent was signed. Patient is being admitted for inpatient treatment for surgery, pain control,  PT, OT, prophylactic antibiotics, VTE prophylaxis, progressive ambulation and ADL's and discharge planning.The patient is planning to be discharged home with home health services

## 2019-10-20 MED ORDER — TRANEXAMIC ACID 1000 MG/10ML IV SOLN
2000.0000 mg | INTRAVENOUS | Status: DC
  Filled 2019-10-20: qty 20

## 2019-10-20 MED ORDER — BUPIVACAINE LIPOSOME 1.3 % IJ SUSP
20.0000 mL | INTRAMUSCULAR | Status: DC
  Filled 2019-10-20: qty 20

## 2019-10-21 ENCOUNTER — Encounter (HOSPITAL_COMMUNITY): Payer: Self-pay | Admitting: Orthopedic Surgery

## 2019-10-21 ENCOUNTER — Other Ambulatory Visit: Payer: Self-pay

## 2019-10-21 ENCOUNTER — Encounter (HOSPITAL_COMMUNITY)
Admission: RE | Disposition: A | Discharge status: Home / Self Care | Payer: Self-pay | Source: Ambulatory Visit | Attending: Orthopedic Surgery

## 2019-10-21 ENCOUNTER — Ambulatory Visit (HOSPITAL_COMMUNITY): Payer: No Typology Code available for payment source | Admitting: Physician Assistant

## 2019-10-21 ENCOUNTER — Ambulatory Visit (HOSPITAL_COMMUNITY)
Admission: RE | Admit: 2019-10-21 | Discharge: 2019-10-21 | Disposition: A | Discharge status: Home / Self Care | Payer: No Typology Code available for payment source | Source: Ambulatory Visit | Attending: Orthopedic Surgery | Admitting: Orthopedic Surgery

## 2019-10-21 DIAGNOSIS — F329 Major depressive disorder, single episode, unspecified: Secondary | ICD-10-CM | POA: Diagnosis not present

## 2019-10-21 DIAGNOSIS — F419 Anxiety disorder, unspecified: Secondary | ICD-10-CM | POA: Insufficient documentation

## 2019-10-21 DIAGNOSIS — Z96652 Presence of left artificial knee joint: Secondary | ICD-10-CM | POA: Insufficient documentation

## 2019-10-21 DIAGNOSIS — F909 Attention-deficit hyperactivity disorder, unspecified type: Secondary | ICD-10-CM | POA: Diagnosis not present

## 2019-10-21 DIAGNOSIS — Y831 Surgical operation with implant of artificial internal device as the cause of abnormal reaction of the patient, or of later complication, without mention of misadventure at the time of the procedure: Secondary | ICD-10-CM | POA: Diagnosis not present

## 2019-10-21 DIAGNOSIS — T8484XA Pain due to internal orthopedic prosthetic devices, implants and grafts, initial encounter: Secondary | ICD-10-CM | POA: Insufficient documentation

## 2019-10-21 DIAGNOSIS — Z87891 Personal history of nicotine dependence: Secondary | ICD-10-CM | POA: Insufficient documentation

## 2019-10-21 DIAGNOSIS — Z79899 Other long term (current) drug therapy: Secondary | ICD-10-CM | POA: Diagnosis not present

## 2019-10-21 DIAGNOSIS — E669 Obesity, unspecified: Secondary | ICD-10-CM | POA: Insufficient documentation

## 2019-10-21 DIAGNOSIS — Z6830 Body mass index (BMI) 30.0-30.9, adult: Secondary | ICD-10-CM | POA: Diagnosis not present

## 2019-10-21 DIAGNOSIS — M25762 Osteophyte, left knee: Secondary | ICD-10-CM | POA: Insufficient documentation

## 2019-10-21 DIAGNOSIS — K219 Gastro-esophageal reflux disease without esophagitis: Secondary | ICD-10-CM | POA: Insufficient documentation

## 2019-10-21 DIAGNOSIS — I10 Essential (primary) hypertension: Secondary | ICD-10-CM | POA: Diagnosis not present

## 2019-10-21 DIAGNOSIS — Z791 Long term (current) use of non-steroidal anti-inflammatories (NSAID): Secondary | ICD-10-CM | POA: Insufficient documentation

## 2019-10-21 HISTORY — PX: TOTAL KNEE REVISION: SHX996

## 2019-10-21 LAB — TYPE AND SCREEN
ABO/RH(D): O NEG
Antibody Screen: NEGATIVE

## 2019-10-21 SURGERY — TOTAL KNEE REVISION
Anesthesia: Spinal | Site: Knee | Laterality: Left

## 2019-10-21 MED ORDER — PROMETHAZINE HCL 25 MG/ML IJ SOLN: 6.2500 mg | INTRAMUSCULAR | Status: DC | PRN

## 2019-10-21 MED ORDER — SIMETHICONE 80 MG PO CHEW
80.0000 mg | CHEWABLE_TABLET | Freq: Once | ORAL | Status: AC
  Administered 2019-10-21: 80 mg via ORAL
  Filled 2019-10-21 (×2): qty 1

## 2019-10-21 MED ORDER — BUPIVACAINE IN DEXTROSE 0.75-8.25 % IT SOLN: INTRATHECAL | Status: DC | PRN

## 2019-10-21 MED ORDER — TRANEXAMIC ACID-NACL 1000-0.7 MG/100ML-% IV SOLN
1000.0000 mg | INTRAVENOUS | Status: AC
  Administered 2019-10-21: 1000 mg via INTRAVENOUS
  Filled 2019-10-21: qty 100

## 2019-10-21 MED ORDER — CEFAZOLIN SODIUM-DEXTROSE 2-4 GM/100ML-% IV SOLN
2.0000 g | INTRAVENOUS | Status: AC
  Administered 2019-10-21: 2 g via INTRAVENOUS
  Filled 2019-10-21: qty 100

## 2019-10-21 MED ORDER — METHOCARBAMOL 500 MG IVPB - SIMPLE MED: 500.0000 mg | Freq: Four times a day (QID) | INTRAVENOUS | Status: DC | PRN

## 2019-10-21 MED ORDER — PROPOFOL 10 MG/ML IV BOLUS
INTRAVENOUS | Status: AC
  Filled 2019-10-21: qty 60

## 2019-10-21 MED ORDER — DEXAMETHASONE SODIUM PHOSPHATE 10 MG/ML IJ SOLN
INTRAMUSCULAR | Status: DC | PRN
  Administered 2019-10-21: 10 mg via INTRAVENOUS

## 2019-10-21 MED ORDER — PROPOFOL 500 MG/50ML IV EMUL
INTRAVENOUS | Status: AC
  Filled 2019-10-21: qty 50

## 2019-10-21 MED ORDER — METHOCARBAMOL 500 MG PO TABS: 500.0000 mg | ORAL_TABLET | Freq: Two times a day (BID) | ORAL | 0 refills | Status: AC

## 2019-10-21 MED ORDER — 0.9 % SODIUM CHLORIDE (POUR BTL) OPTIME
TOPICAL | Status: DC | PRN
  Administered 2019-10-21: 1000 mL

## 2019-10-21 MED ORDER — CLONIDINE HCL (ANALGESIA) 100 MCG/ML EP SOLN
EPIDURAL | Status: DC | PRN
  Administered 2019-10-21: 100 ug

## 2019-10-21 MED ORDER — PROPOFOL 500 MG/50ML IV EMUL
INTRAVENOUS | Status: DC | PRN
  Administered 2019-10-21: 100 ug/kg/min via INTRAVENOUS

## 2019-10-21 MED ORDER — CHLORHEXIDINE GLUCONATE 4 % EX LIQD: 60.0000 mL | Freq: Once | CUTANEOUS | Status: DC

## 2019-10-21 MED ORDER — MIDAZOLAM HCL 2 MG/2ML IJ SOLN
1.0000 mg | Freq: Once | INTRAMUSCULAR | Status: AC
  Administered 2019-10-21: 2 mg via INTRAVENOUS
  Filled 2019-10-21: qty 2

## 2019-10-21 MED ORDER — METOPROLOL SUCCINATE ER 50 MG PO TB24
50.0000 mg | ORAL_TABLET | Freq: Once | ORAL | Status: AC
  Administered 2019-10-21: 50 mg via ORAL
  Filled 2019-10-21: qty 1

## 2019-10-21 MED ORDER — MIDAZOLAM HCL 5 MG/5ML IJ SOLN
INTRAMUSCULAR | Status: DC | PRN
  Administered 2019-10-21: 2 mg via INTRAVENOUS

## 2019-10-21 MED ORDER — TRANEXAMIC ACID-NACL 1000-0.7 MG/100ML-% IV SOLN: 1000.0000 mg | Freq: Once | INTRAVENOUS | Status: DC

## 2019-10-21 MED ORDER — SODIUM CHLORIDE (PF) 0.9 % IJ SOLN
INTRAMUSCULAR | Status: DC | PRN
  Administered 2019-10-21: 70 mL

## 2019-10-21 MED ORDER — FENTANYL CITRATE (PF) 100 MCG/2ML IJ SOLN: 25.0000 ug | INTRAMUSCULAR | Status: DC | PRN

## 2019-10-21 MED ORDER — FENTANYL CITRATE (PF) 100 MCG/2ML IJ SOLN
INTRAMUSCULAR | Status: DC | PRN
  Administered 2019-10-21: 100 ug via INTRAVENOUS

## 2019-10-21 MED ORDER — OXYCODONE HCL 5 MG PO TABS
ORAL_TABLET | ORAL | Status: AC
  Filled 2019-10-21: qty 1

## 2019-10-21 MED ORDER — BUPIVACAINE HCL 0.25 % IJ SOLN
INTRAMUSCULAR | Status: DC | PRN
  Administered 2019-10-21: 30 mL

## 2019-10-21 MED ORDER — METHOCARBAMOL 500 MG PO TABS
500.0000 mg | ORAL_TABLET | Freq: Four times a day (QID) | ORAL | Status: DC | PRN
  Administered 2019-10-21: 500 mg via ORAL

## 2019-10-21 MED ORDER — FENTANYL CITRATE (PF) 100 MCG/2ML IJ SOLN
50.0000 ug | Freq: Once | INTRAMUSCULAR | Status: AC
  Administered 2019-10-21: 50 ug via INTRAVENOUS
  Filled 2019-10-21: qty 2

## 2019-10-21 MED ORDER — OXYCODONE HCL 5 MG PO TABS: 5.0000 mg | ORAL_TABLET | ORAL | 0 refills | Status: AC | PRN

## 2019-10-21 MED ORDER — LACTATED RINGERS IV BOLUS
500.0000 mL | Freq: Once | INTRAVENOUS | Status: AC
  Administered 2019-10-21: 500 mL via INTRAVENOUS

## 2019-10-21 MED ORDER — BUPIVACAINE LIPOSOME 1.3 % IJ SUSP
INTRAMUSCULAR | Status: DC | PRN
  Administered 2019-10-21: 20 mL

## 2019-10-21 MED ORDER — MIDAZOLAM HCL 2 MG/2ML IJ SOLN
INTRAMUSCULAR | Status: AC
  Filled 2019-10-21: qty 2

## 2019-10-21 MED ORDER — METHOCARBAMOL 500 MG PO TABS
ORAL_TABLET | ORAL | Status: AC
  Filled 2019-10-21: qty 1

## 2019-10-21 MED ORDER — ACETAMINOPHEN 500 MG PO TABS
1000.0000 mg | ORAL_TABLET | Freq: Once | ORAL | Status: AC
  Administered 2019-10-21: 1000 mg via ORAL
  Filled 2019-10-21: qty 2

## 2019-10-21 MED ORDER — LACTATED RINGERS IV SOLN: INTRAVENOUS | Status: DC

## 2019-10-21 MED ORDER — EPHEDRINE SULFATE 50 MG/ML IJ SOLN
INTRAMUSCULAR | Status: DC | PRN
  Administered 2019-10-21: 10 mg via INTRAVENOUS

## 2019-10-21 MED ORDER — ONDANSETRON HCL 4 MG/2ML IJ SOLN
INTRAMUSCULAR | Status: AC
  Filled 2019-10-21: qty 2

## 2019-10-21 MED ORDER — PROPOFOL 10 MG/ML IV BOLUS
INTRAVENOUS | Status: DC | PRN
  Administered 2019-10-21: 20 mg via INTRAVENOUS

## 2019-10-21 MED ORDER — ONDANSETRON HCL 4 MG/2ML IJ SOLN
INTRAMUSCULAR | Status: DC | PRN
  Administered 2019-10-21: 4 mg via INTRAVENOUS

## 2019-10-21 MED ORDER — ASPIRIN EC 81 MG PO TBEC: 81.0000 mg | DELAYED_RELEASE_TABLET | Freq: Two times a day (BID) | ORAL | 0 refills | Status: AC

## 2019-10-21 MED ORDER — PHENYLEPHRINE HCL-NACL 10-0.9 MG/250ML-% IV SOLN
INTRAVENOUS | Status: DC | PRN
  Administered 2019-10-21: 25 ug/min via INTRAVENOUS

## 2019-10-21 MED ORDER — SODIUM CHLORIDE 0.9 % IR SOLN
Status: DC | PRN
  Administered 2019-10-21: 1000 mL

## 2019-10-21 MED ORDER — FENTANYL CITRATE (PF) 100 MCG/2ML IJ SOLN
INTRAMUSCULAR | Status: AC
  Filled 2019-10-21: qty 2

## 2019-10-21 MED ORDER — BUPIVACAINE IN DEXTROSE 0.75-8.25 % IT SOLN
INTRATHECAL | Status: DC | PRN
  Administered 2019-10-21: 1.6 mg via INTRATHECAL

## 2019-10-21 MED ORDER — OXYCODONE HCL 5 MG PO TABS
5.0000 mg | ORAL_TABLET | ORAL | Status: DC | PRN
  Administered 2019-10-21: 5 mg via ORAL

## 2019-10-21 MED ORDER — LACTATED RINGERS IV BOLUS: 250.0000 mL | Freq: Once | INTRAVENOUS | Status: DC

## 2019-10-21 MED ORDER — EPHEDRINE 5 MG/ML INJ
INTRAVENOUS | Status: AC
  Filled 2019-10-21: qty 10

## 2019-10-21 MED ORDER — POVIDONE-IODINE 10 % EX SWAB
2.0000 "application " | Freq: Once | CUTANEOUS | Status: AC
  Administered 2019-10-21: 2 via TOPICAL

## 2019-10-21 MED ORDER — ROPIVACAINE HCL 7.5 MG/ML IJ SOLN
INTRAMUSCULAR | Status: DC | PRN
  Administered 2019-10-21: 20 mL via PERINEURAL

## 2019-10-21 MED ORDER — DEXAMETHASONE SODIUM PHOSPHATE 10 MG/ML IJ SOLN
INTRAMUSCULAR | Status: AC
  Filled 2019-10-21: qty 1

## 2019-10-21 MED ORDER — LACTATED RINGERS IV BOLUS
250.0000 mL | Freq: Once | INTRAVENOUS | Status: AC
  Administered 2019-10-21: 250 mL via INTRAVENOUS

## 2019-10-21 SURGICAL SUPPLY — 56 items
ATTUNE PSRP INSR SZ7 7 KNEE (Insert) ×2 IMPLANT
ATTUNE PSRP INSR SZ7 7MM KNEE (Insert) ×1 IMPLANT
BAG DECANTER FOR FLEXI CONT (MISCELLANEOUS) ×3 IMPLANT
BAG ZIPLOCK 12X15 (MISCELLANEOUS) ×3 IMPLANT
BLADE OSCILLATING/SAGITTAL (BLADE) ×2
BLADE SAG 18X100X1.27 (BLADE) ×3 IMPLANT
BLADE SAW SAG 90X13X1.27 (BLADE) ×3 IMPLANT
BLADE SAW SGTL 81X20 HD (BLADE) ×3 IMPLANT
BLADE SURG SZ10 CARB STEEL (BLADE) ×6 IMPLANT
BLADE SW THK.38XMED LNG THN (BLADE) ×1 IMPLANT
BNDG ELASTIC 6X10 VLCR STRL LF (GAUZE/BANDAGES/DRESSINGS) ×6 IMPLANT
BOWL SMART MIX CTS (DISPOSABLE) ×3 IMPLANT
BRUSH FEMORAL CANAL (MISCELLANEOUS) ×3 IMPLANT
BUR OVAL CARBIDE 4.0 (BURR) IMPLANT
COVER SURGICAL LIGHT HANDLE (MISCELLANEOUS) ×3 IMPLANT
COVER WAND RF STERILE (DRAPES) IMPLANT
CUFF TOURN SGL QUICK 34 (TOURNIQUET CUFF) ×2
CUFF TRNQT CYL 34X4.125X (TOURNIQUET CUFF) ×1 IMPLANT
DECANTER SPIKE VIAL GLASS SM (MISCELLANEOUS) IMPLANT
DRAPE ORTHO SPLIT 77X108 STRL (DRAPES) ×4
DRAPE SURG ORHT 6 SPLT 77X108 (DRAPES) ×2 IMPLANT
DRAPE U-SHAPE 47X51 STRL (DRAPES) ×3 IMPLANT
DRESSING AQUACEL AG SP 3.5X10 (GAUZE/BANDAGES/DRESSINGS) IMPLANT
DRSG AQUACEL AG ADV 3.5X14 (GAUZE/BANDAGES/DRESSINGS) IMPLANT
DRSG AQUACEL AG SP 3.5X10 (GAUZE/BANDAGES/DRESSINGS)
DURAPREP 26ML APPLICATOR (WOUND CARE) ×3 IMPLANT
ELECT REM PT RETURN 15FT ADLT (MISCELLANEOUS) ×3 IMPLANT
GLOVE BIO SURGEON STRL SZ7.5 (GLOVE) ×3 IMPLANT
GLOVE BIO SURGEON STRL SZ8.5 (GLOVE) ×3 IMPLANT
GLOVE BIOGEL PI IND STRL 8 (GLOVE) ×1 IMPLANT
GLOVE BIOGEL PI IND STRL 9 (GLOVE) ×1 IMPLANT
GLOVE BIOGEL PI INDICATOR 8 (GLOVE) ×2
GLOVE BIOGEL PI INDICATOR 9 (GLOVE) ×2
GOWN STRL REUS W/TWL XL LVL3 (GOWN DISPOSABLE) ×6 IMPLANT
HANDPIECE INTERPULSE COAX TIP (DISPOSABLE) ×2
IMMOBILIZER KNEE 20 (SOFTGOODS) ×3
IMMOBILIZER KNEE 20 THIGH 36 (SOFTGOODS) ×1 IMPLANT
KIT TURNOVER KIT A (KITS) IMPLANT
NEEDLE HYPO 21X1.5 SAFETY (NEEDLE) ×6 IMPLANT
NS IRRIG 1000ML POUR BTL (IV SOLUTION) ×3 IMPLANT
PACK TOTAL KNEE CUSTOM (KITS) ×3 IMPLANT
PENCIL SMOKE EVACUATOR (MISCELLANEOUS) IMPLANT
PROTECTOR NERVE ULNAR (MISCELLANEOUS) ×3 IMPLANT
SET HNDPC FAN SPRY TIP SCT (DISPOSABLE) ×1 IMPLANT
STAPLER VISISTAT 35W (STAPLE) IMPLANT
SUT VIC AB 1 CTX 36 (SUTURE) ×2
SUT VIC AB 1 CTX36XBRD ANBCTR (SUTURE) ×1 IMPLANT
SUT VIC AB 3-0 CT1 27 (SUTURE) ×2
SUT VIC AB 3-0 CT1 TAPERPNT 27 (SUTURE) ×1 IMPLANT
SWAB COLLECTION DEVICE MRSA (MISCELLANEOUS) IMPLANT
SWAB CULTURE ESWAB REG 1ML (MISCELLANEOUS) IMPLANT
SYR CONTROL 10ML LL (SYRINGE) ×6 IMPLANT
TRAY FOLEY MTR SLVR 16FR STAT (SET/KITS/TRAYS/PACK) ×3 IMPLANT
WATER STERILE IRR 1000ML POUR (IV SOLUTION) ×6 IMPLANT
WRAP KNEE MAXI GEL POST OP (GAUZE/BANDAGES/DRESSINGS) ×3 IMPLANT
YANKAUER SUCT BULB TIP 10FT TU (MISCELLANEOUS) ×3 IMPLANT

## 2019-10-21 NOTE — Anesthesia Postprocedure Evaluation (Signed)
Anesthesia Post Note  Patient: Steven Abbott  Procedure(s) Performed: RADICAL SYNOVECTOMY, REVISION OF TIBIAL BEARING, REMOVAL OSTEOPHYTE (Left Knee)     Patient location during evaluation: Nursing Unit Anesthesia Type: Spinal Level of consciousness: oriented and awake and alert Pain management: pain level controlled Vital Signs Assessment: post-procedure vital signs reviewed and stable Respiratory status: spontaneous breathing and respiratory function stable Cardiovascular status: blood pressure returned to baseline and stable Postop Assessment: no headache, no backache, no apparent nausea or vomiting and patient able to bend at knees Anesthetic complications: no    Last Vitals:  Vitals:   10/21/19 1355 10/21/19 1400  BP: 114/69 120/62  Pulse: 63 64  Resp: 20 (!) 26  Temp: 36.8 C   SpO2: 100% 100%    Last Pain:  Vitals:   10/21/19 1020  TempSrc: Oral  PainSc: 0-No pain                 Trevor Iha

## 2019-10-21 NOTE — Anesthesia Preprocedure Evaluation (Addendum)
Anesthesia Evaluation  Patient identified by MRN, date of birth, ID band Patient awake    Reviewed: Allergy & Precautions, NPO status , Patient's Chart, lab work & pertinent test results, reviewed documented beta blocker date and time   Airway Mallampati: II  TM Distance: >3 FB Neck ROM: Full    Dental  (+) Teeth Intact, Dental Advisory Given   Pulmonary former smoker,    Pulmonary exam normal breath sounds clear to auscultation       Cardiovascular hypertension, Pt. on home beta blockers Normal cardiovascular exam Rhythm:Regular Rate:Normal     Neuro/Psych PSYCHIATRIC DISORDERS Anxiety Depression negative neurological ROS     GI/Hepatic Neg liver ROS, GERD  Medicated,  Endo/Other  Obesity   Renal/GU negative Renal ROS     Musculoskeletal  (+) Arthritis ,   Abdominal   Peds  (+) ADHD Hematology negative hematology ROS (+) Plt 267k   Anesthesia Other Findings Day of surgery medications reviewed with the patient.  Reproductive/Obstetrics                            Anesthesia Physical Anesthesia Plan  ASA: II  Anesthesia Plan: Spinal   Post-op Pain Management:  Regional for Post-op pain   Induction:   PONV Risk Score and Plan: 1 and Propofol infusion and Treatment may vary due to age or medical condition  Airway Management Planned: Natural Airway and Nasal Cannula  Additional Equipment:   Intra-op Plan:   Post-operative Plan:   Informed Consent: I have reviewed the patients History and Physical, chart, labs and discussed the procedure including the risks, benefits and alternatives for the proposed anesthesia with the patient or authorized representative who has indicated his/her understanding and acceptance.     Dental advisory given  Plan Discussed with: CRNA, Anesthesiologist and Surgeon  Anesthesia Plan Comments:         Anesthesia Quick Evaluation

## 2019-10-21 NOTE — Evaluation (Signed)
Physical Therapy Evaluation Patient Details Name: Steven Abbott MRN: [id number] DOB: 1971/09/27 Today's Date: 10/21/2019   History of Present Illness  s/p RADICAL SYNOVECTOMY, REVISION OF TIBIAL BEARING, REMOVAL OSTEOPHYTE L knee; PMH: L TKA 02/24/20, HTN  Clinical Impression  Patient evaluated by Physical Therapy with no further acute PT needs identified. All education has been completed and the patient has no further questions.  Pt familiar with techniques and HEP from previous surgery. Discussed stairs and pt feels comfortable with technique used from prior surgeries. Ready for d/c from PT standpoint. Has DME and HHPT set up See below for any follow-up Physical Therapy or equipment needs. PT is signing off. Thank you for this referral.    Follow Up Recommendations Follow surgeon's recommendation for DC plan and follow-up therapies    Equipment Recommendations  None recommended by PT    Recommendations for Other Services       Precautions / Restrictions Precautions Precautions: Knee Restrictions Weight Bearing Restrictions: No Other Position/Activity Restrictions: WBAT      Mobility  Bed Mobility Overal bed mobility: Needs Assistance Bed Mobility: Supine to Sit;Sit to Supine     Supine to sit: Min guard Sit to supine: Min guard   General bed mobility comments: for safety  Transfers Overall transfer level: Needs assistance Equipment used: Rolling walker (2 wheeled) Transfers: Sit to/from [facility] Sit to Stand: Min guard Stand pivot transfers: Min guard       General transfer comment: cues for safety and hand placement  Ambulation/Gait Ambulation/Gait assistance: Min guard Gait Distance (Feet): 150 Feet Assistive device: Rolling walker (2 wheeled) Gait Pattern/deviations: Step-to pattern;Step-through pattern;Decreased stance time - left;Decreased weight shift to left     General Gait Details: cues for sequence and RW safety  Stairs             Wheelchair Mobility    Modified Rankin (Stroke Patients Only)       Balance                                             Pertinent Vitals/Pain Pain Assessment: 0-10 Pain Score: 2  Pain Location: left knee Pain Descriptors / Indicators: Grimacing;Guarding Pain Intervention(s): Limited activity within patient's tolerance;Monitored during session;Premedicated before session    Home Living Family/patient expects to be discharged to:: Private residence Living Arrangements: Spouse/significant other Available Help at Discharge: Family Type of Home: House Home Access: Stairs to enter   Technical brewer of Steps: 4  or 18 up from basement Home Layout: One level Home Equipment: Environmental consultant - 2 wheels;Bedside commode      Prior Function Level of Independence: Independent               Hand Dominance        Extremity/Trunk Assessment   Upper Extremity Assessment Upper Extremity Assessment: Overall WFL for tasks assessed    Lower Extremity Assessment Lower Extremity Assessment: LLE deficits/detail LLE Deficits / Details: ankle WFL, knee and hip grossly 3/5 (perineum still numb) knee flexion 10 to 70 degrees grossly       Communication   Communication: No difficulties  Cognition Arousal/Alertness: Awake/alert Behavior During Therapy: WFL for tasks assessed/performed Overall Cognitive Status: Within Functional Limits for tasks assessed  General Comments      Exercises Total Joint Exercises Ankle Circles/Pumps: AROM;Both;5 reps Quad Sets: AROM;Both;5 reps Long Arc Quad: AROM;Left;10 reps   Assessment/Plan    PT Assessment All further PT needs can be met in the next venue of care  PT Problem List         PT Treatment Interventions      PT Goals (Current goals can be found in the Care Plan section)  Acute Rehab PT Goals PT Goal Formulation: With patient Time For Goal  Achievement: 10/28/19 Potential to Achieve Goals: Good    Frequency     Barriers to discharge        Co-evaluation               AM-PAC PT "6 Clicks" Mobility  Outcome Measure   Help needed moving from lying on your back to sitting on the side of a flat bed without using bedrails?: None Help needed moving to and from a bed to a chair (including a wheelchair)?: A Little Help needed standing up from a chair using your arms (e.g., wheelchair or bedside chair)?: A Little Help needed to walk in hospital room?: A Little Help needed climbing 3-5 steps with a railing? : A Little 6 Click Score: 16    End of Session Equipment Utilized During Treatment: Gait belt Activity Tolerance: Patient tolerated treatment well Patient left: in bed;with call bell/phone within reach;with nursing/sitter in room Nurse Communication: Mobility status PT Visit Diagnosis: Difficulty in walking, not elsewhere classified (R26.2)    Time: 0052-5910 PT Time Calculation (min) (ACUTE ONLY): 25 min   Charges:   PT Evaluation $PT Eval Low Complexity: 1 Low PT Treatments $Gait Training: 8-22 mins        Baxter Flattery, PT   Acute Rehab Dept [facility]): [phone]   10/21/2019   [facility] 10/21/2019, 4:47 PM

## 2019-10-21 NOTE — Anesthesia Procedure Notes (Signed)
Anesthesia Regional Block: Adductor canal block   Pre-Anesthetic Checklist: ,, timeout performed, Correct Patient, Correct Site, Correct Laterality, Correct Procedure, Correct Position, site marked, Risks and benefits discussed,  Surgical consent,  Pre-op evaluation,  At surgeon's request and post-op pain management  Laterality: Left  Prep: chloraprep       Needles:  Injection technique: Single-shot  Needle Type: Echogenic Needle     Needle Length: 9cm  Needle Gauge: 21     Additional Needles:   Procedures:,,,, ultrasound used (permanent image in chart),,,,  Narrative:  Start time: 10/21/2019 11:29 AM End time: 10/21/2019 11:34 AM Injection made incrementally with aspirations every 5 mL.  Performed by: Personally  Anesthesiologist: Cecile Hearing, MD  Additional Notes: No pain on injection. No increased resistance to injection. Injection made in 5cc increments.  Good needle visualization.  Patient tolerated procedure well.

## 2019-10-21 NOTE — Progress Notes (Signed)
AssistedDr. Turk with left, ultrasound guided, adductor canal block. Side rails up, monitors on throughout procedure. See vital signs in flow sheet. Tolerated Procedure well.  

## 2019-10-21 NOTE — Anesthesia Procedure Notes (Signed)
Procedure Name: MAC Date/Time: 10/21/2019 12:04 PM Performed by: Lissa Morales, CRNA Pre-anesthesia Checklist: Patient identified, Emergency Drugs available, Suction available, Patient being monitored and Timeout performed Patient Re-evaluated:Patient Re-evaluated prior to induction Oxygen Delivery Method: Simple face mask Placement Confirmation: positive ETCO2

## 2019-10-21 NOTE — Discharge Instructions (Signed)

## 2019-10-21 NOTE — Anesthesia Procedure Notes (Signed)
Spinal  Patient location during procedure: OR End time: 10/21/2019 12:10 PM Staffing Resident/CRNA: Lissa Morales, CRNA Preanesthetic Checklist Completed: patient identified, IV checked, site marked, risks and benefits discussed, surgical consent, monitors and equipment checked, pre-op evaluation and timeout performed Spinal Block Patient position: sitting Prep: DuraPrep Patient monitoring: heart rate, continuous pulse ox and blood pressure Approach: midline Location: L3-4 Injection technique: single-shot Needle Needle type: Pencan  Needle gauge: 24 G Needle length: 9 cm Additional Notes Expiration date of kit checked and confirmed. Patient tolerated procedure well, without complications.

## 2019-10-21 NOTE — Transfer of Care (Signed)
Immediate Anesthesia Transfer of Care Note  Patient: Kyan Yurkovich  Procedure(s) Performed: RADICAL SYNOVECTOMY, REVISION OF TIBIAL BEARING, REMOVAL OSTEOPHYTE (Left Knee)  Patient Location: PACU  Anesthesia Type:Spinal  Level of Consciousness: awake, alert , oriented and patient cooperative  Airway & Oxygen Therapy: Patient Spontanous Breathing and Patient connected to face mask oxygen  Post-op Assessment: Report given to RN and Post -op Vital signs reviewed and stable  Post vital signs: stable  Last Vitals:  Vitals Value Taken Time  BP 120/62 10/21/19 1400  Temp    Pulse 64 10/21/19 1400  Resp 26 10/21/19 1400  SpO2 100 % 10/21/19 1400  Vitals shown include unvalidated device data.  Last Pain:  Vitals:   10/21/19 1020  TempSrc: Oral  PainSc: 0-No pain      Patients Stated Pain Goal: 3 (10/21/19 1020)  Complications: No apparent anesthesia complications

## 2019-10-21 NOTE — Op Note (Signed)
PATIENT ID:      Steven Abbott  MRN:     [id number] DOB/AGE:    03-20-1971 / 49 y.o.       OPERATIVE REPORT   DATE OF PROCEDURE:  10/21/2019      PREOPERATIVE DIAGNOSIS:   POSSIBLE LOOSENING RIGHT KNEE REPLACEMENT      Estimated body mass index is 30.4 kg/m as calculated from the following:   Height as of this encounter: 5\' 11"  (1.803 m).   Weight as of this encounter: 98.9 kg.                                                       POSTOPERATIVE DIAGNOSIS:   PAINFUL LEFT TOTAL KNEE                                                                       PROCEDURE:  Procedure(s): RADICAL SYNOVECTOMY, REVISION OF TIBIAL BEARING, REMOVAL OSTEOPHYTE.  Tibial bearing was revised to a new #7 7 mm attune RP    SURGEON: [name]  ASSISTANT:   [name]. 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  She will radiographs that show no change in the position of the Depuy attune prostheses cemented.  Bone scan accomplished at the 1-1/2-year mark was unremarkable CBC sed rate and CRP were all normal.  Pain is anterolateral is worse with prolonged standing having said that his range of motion is from 0 to 125 degrees collateral ligaments are stable and has never had an effusion.  Counseled patient to avoid any further surgery however he is desperate to get pain relief and wants to proceed with exploration of his left total knee I think the chance of this helping him is somewhere less than 50%,  patient's been made aware of this numerous times.  Other risks and benefits of surgery been discussed patient would like to proceed.   DESCRIPTION OF PROCEDURE: The patient identified by armband, received  IV antibiotics, in the holding area at [facility]. Patient taken to the operating room, appropriate anesthetic monitors were attached, and Spinal anesthesia was  induced. IV Tranexamic acid was given.Tourniquet applied high to the operative thigh. Lateral post and foot positioner applied to the table, the lower extremity was then prepped and draped in usual sterile fashion from the toes to the tourniquet. Time-out procedure was performed. The skin and subcutaneous tissue along the incision was injected with 20 cc of a mixture of Exparel and Marcaine solution, using a 20-gauge by 1-1/2 inch needle. We began the operation, with  the knee flexed 130 degrees, by making the anterior midline incision starting at handbreadth above the patella going over the patella 1 cm medial to and 4 cm distal to the tibial tubercle. Small bleeders in the skin and the subcutaneous tissue identified and cauterized. Transverse retinaculum was incised and reflected medially and a medial parapatellar arthrotomy was accomplished. the patella was everted and theprepatellar fat pad resected. The superficial medial collateral ligament was then elevated from anterior to posterior along the proximal flare of the tibia.  As we remove the scar tissue from the anterior aspect of the knee it became obvious the patient had a large 2 cm x 1-1/2 cm x 1 cm bony growth that was proximal to the tibial tubercle and abutting the patellar tendon with some of the fibers of the patellar tendon actually inserting onto it.  We carefully teased the fibers off the bony osteophyte and were able to remove it.  This is also in the location of where a significant amount of this discomfort was.  Photographic documentation was made of the osteophyte in its bed  and after we had removed it.  This allowed [place] to evert the patella and hyperflexed the knee the RP bearing was then sectioned with a 1 inch wide osteotome allowing [place] to fully examine the posterior aspect of the knee.  After removing scar tissue from around all the implants we tested the stability of the implants with an osteotome and a mallet and nothing was found to be loose on the femur or tibia or patella.  At this point the wound was thoroughly irrigated out with normal saline solution pulse lavage a new 7 x 7 mm RP bearing was inserted the knee was taken through range of motion to make sure that stability remained.  TXA sponge was placed in the wound for 1 minute and then removed and all the soft tissue was infiltrated with Exparel. The rest of the Exparel was injected into the parapatellar arthrotomy, subcutaneous tissues, and periosteal tissues. The parapatellar arthrotomy was closed with running #1 Vicryl suture. The subcutaneous tissue with 0 and 2-0 undyed Vicryl suture, and the skin with running 3-0 SQ vicryl. An Aquacil and Ace wrap were applied. The patient was taken to recovery room without difficulty.   [name] 10/21/2019, 1:15 PM

## 2019-10-22 ENCOUNTER — Encounter: Payer: Self-pay | Admitting: *Deleted

## 2020-11-10 ENCOUNTER — Other Ambulatory Visit: Payer: Self-pay | Admitting: Orthopedic Surgery

## 2020-11-10 ENCOUNTER — Other Ambulatory Visit (HOSPITAL_COMMUNITY): Payer: Self-pay | Admitting: Orthopedic Surgery

## 2020-11-10 DIAGNOSIS — Z96652 Presence of left artificial knee joint: Secondary | ICD-10-CM

## 2020-11-20 ENCOUNTER — Encounter (HOSPITAL_COMMUNITY)
Admission: RE | Admit: 2020-11-20 | Discharge: 2020-11-20 | Disposition: A | Discharge status: Home / Self Care | Payer: No Typology Code available for payment source | Source: Ambulatory Visit | Attending: Orthopedic Surgery | Admitting: Orthopedic Surgery

## 2020-11-20 ENCOUNTER — Other Ambulatory Visit: Payer: Self-pay

## 2020-11-20 DIAGNOSIS — Z96652 Presence of left artificial knee joint: Secondary | ICD-10-CM

## 2020-11-20 MED ORDER — TECHNETIUM TC 99M MEDRONATE IV KIT
20.0000 | PACK | Freq: Once | INTRAVENOUS | Status: AC | PRN
  Administered 2020-11-20: 20 via INTRAVENOUS

## 2023-02-17 IMAGING — NM NM BONE 3 PHASE
8 series · 8 of 8 positions shown · non-contrast
Comparison: 09/05/2019

Radiographic correlation: None

CLINICAL DATA: LEFT knee pain, prior LEFT knee arthroplasty on
02/25/2019, revised 10/21/2019

EXAM:
NUCLEAR MEDICINE 3-PHASE BONE SCAN
TECHNIQUE: Radionuclide angiographic images, immediate static blood pool
images, and 3-hour delayed static images were obtained of the knees
after intravenous injection of radiopharmaceutical.
RADIOPHARMACEUTICALS:  Two 1 mCi Xc-JJm MDP IV

[Series 1: delay · delayed · 2.07mm/px · 1 of 1 slices shown (1 of 4)]
[im 1/1]
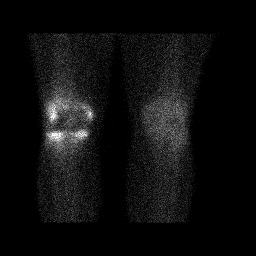

[Series 1: delay · delayed · 2.07mm/px · 1 of 1 slices shown (2 of 4)]
[im 1/1]
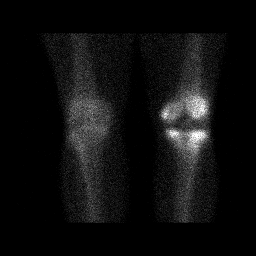

[Series 2: delay · delayed · 2.07mm/px · 1 of 1 slices shown (3 of 4)]
[im 1/1  full-range]
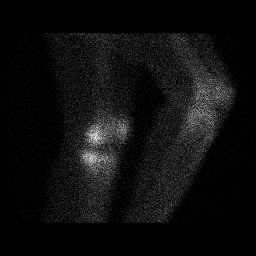

[Series 2: blood pool · 2.07mm/px · 1 of 1 slices shown (1 of 2)]
[im 1/1  full-range]
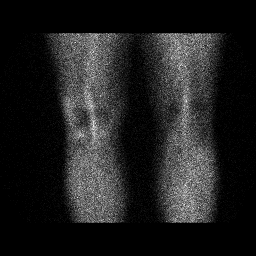

[Series 2: delay · delayed · 2.07mm/px · 1 of 1 slices shown (4 of 4)]
[im 1/1  full-range]
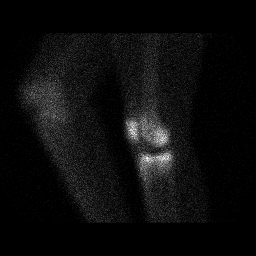

[Series 2: blood pool · 2.07mm/px · 1 of 1 slices shown (2 of 2)]
[im 1/1  full-range]
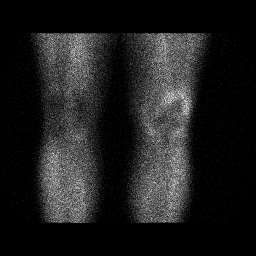

[Series 3: lat bp · 2.07mm/px · 1 of 1 slices shown (1 of 2)]
[im 1/1  full-range]
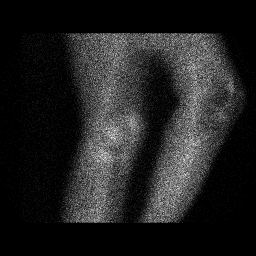

[Series 3: lat bp · 2.07mm/px · 1 of 1 slices shown (2 of 2)]
[im 1/1  full-range]
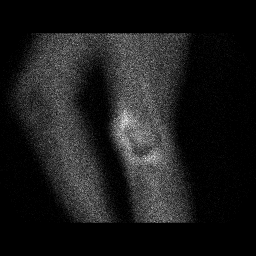

[8 of 8 positions shown; findings below may reference images not displayed]

FINDINGS: Vascular phase: Increased blood flow to periarticular regions of
LEFT knee. Normal blood flow RIGHT knee.

Blood pool phase: Increased blood pool at LEFT knee joint especially
laterally. Normal blood pool RIGHT knee.

Delayed phase: Normal tracer uptake RIGHT knee on delayed images.
Focal increased tracer uptake at the medial and lateral LEFT tibial
plateau adjacent to the prosthesis on delayed images suspicious for
aseptic loosening though infection is not entirely excluded.
IMPRESSION: Increased blood flow, blood pool, and delayed uptake of tracer at
the LEFT knee suspicious for aseptic loosening of the LEFT knee
prosthesis though infection is not excluded, as discussed above.
